# Patient Record
Sex: Female | Born: 1975 | Race: Black or African American | Hispanic: No | Marital: Married | Smoking: Never smoker
Health system: Southern US, Community
[De-identification: ages and names within clinical notes are randomized; demographics above are authoritative.]

## PROBLEM LIST (undated history)

## (undated) DIAGNOSIS — D649 Anemia, unspecified: Secondary | ICD-10-CM

## (undated) DIAGNOSIS — M67439 Ganglion, unspecified wrist: Secondary | ICD-10-CM

## (undated) DIAGNOSIS — I10 Essential (primary) hypertension: Secondary | ICD-10-CM

## (undated) HISTORY — PX: WISDOM TOOTH EXTRACTION: SHX21

---

## 2000-08-23 ENCOUNTER — Emergency Department (HOSPITAL_COMMUNITY): Admission: EM | Admit: 2000-08-23 | Discharge: 2000-08-23 | Payer: Self-pay | Admitting: Emergency Medicine

## 2000-08-23 ENCOUNTER — Encounter: Payer: Self-pay | Admitting: Internal Medicine

## 2000-12-30 ENCOUNTER — Emergency Department (HOSPITAL_COMMUNITY): Admission: EM | Admit: 2000-12-30 | Discharge: 2000-12-30 | Payer: Self-pay | Admitting: Emergency Medicine

## 2001-06-11 ENCOUNTER — Other Ambulatory Visit: Admission: RE | Admit: 2001-06-11 | Discharge: 2001-06-11 | Payer: Self-pay | Admitting: Obstetrics and Gynecology

## 2002-03-19 ENCOUNTER — Encounter: Payer: Self-pay | Admitting: Emergency Medicine

## 2002-03-19 ENCOUNTER — Emergency Department (HOSPITAL_COMMUNITY): Admission: EM | Admit: 2002-03-19 | Discharge: 2002-03-19 | Payer: Self-pay | Admitting: Emergency Medicine

## 2002-07-29 ENCOUNTER — Ambulatory Visit (HOSPITAL_COMMUNITY): Admission: RE | Admit: 2002-07-29 | Discharge: 2002-07-29 | Payer: Self-pay | Admitting: Obstetrics and Gynecology

## 2002-07-29 ENCOUNTER — Encounter: Payer: Self-pay | Admitting: Obstetrics and Gynecology

## 2002-09-27 ENCOUNTER — Encounter: Payer: Self-pay | Admitting: Obstetrics and Gynecology

## 2002-09-27 ENCOUNTER — Ambulatory Visit (HOSPITAL_COMMUNITY): Admission: RE | Admit: 2002-09-27 | Discharge: 2002-09-27 | Payer: Self-pay | Admitting: Obstetrics and Gynecology

## 2002-12-23 ENCOUNTER — Inpatient Hospital Stay (HOSPITAL_COMMUNITY): Admission: AD | Admit: 2002-12-23 | Discharge: 2002-12-23 | Payer: Self-pay | Admitting: *Deleted

## 2002-12-24 ENCOUNTER — Inpatient Hospital Stay (HOSPITAL_COMMUNITY): Admission: AD | Admit: 2002-12-24 | Discharge: 2002-12-27 | Payer: Self-pay | Admitting: Obstetrics and Gynecology

## 2003-02-05 ENCOUNTER — Other Ambulatory Visit: Admission: RE | Admit: 2003-02-05 | Discharge: 2003-02-05 | Payer: Self-pay | Admitting: Obstetrics and Gynecology

## 2004-03-10 ENCOUNTER — Other Ambulatory Visit: Admission: RE | Admit: 2004-03-10 | Discharge: 2004-03-10 | Payer: Self-pay | Admitting: Obstetrics and Gynecology

## 2005-05-09 ENCOUNTER — Other Ambulatory Visit: Admission: RE | Admit: 2005-05-09 | Discharge: 2005-05-09 | Payer: Self-pay | Admitting: Obstetrics and Gynecology

## 2005-11-24 ENCOUNTER — Observation Stay (HOSPITAL_COMMUNITY): Admission: AD | Admit: 2005-11-24 | Discharge: 2005-11-24 | Payer: Self-pay | Admitting: Obstetrics and Gynecology

## 2005-11-25 ENCOUNTER — Inpatient Hospital Stay (HOSPITAL_COMMUNITY): Admission: RE | Admit: 2005-11-25 | Discharge: 2005-11-28 | Payer: Self-pay | Admitting: Obstetrics and Gynecology

## 2008-06-29 ENCOUNTER — Inpatient Hospital Stay (HOSPITAL_COMMUNITY): Admission: AD | Admit: 2008-06-29 | Discharge: 2008-07-01 | Payer: Self-pay | Admitting: Obstetrics and Gynecology

## 2010-06-23 LAB — CBC
HCT: 39.3 % (ref 36.0–46.0)
Hemoglobin: 12.9 g/dL (ref 12.0–15.0)
Hemoglobin: 9.4 g/dL — ABNORMAL LOW (ref 12.0–15.0)
MCHC: 32.8 g/dL (ref 30.0–36.0)
MCHC: 32.8 g/dL (ref 30.0–36.0)
MCV: 82.4 fL (ref 78.0–100.0)
MCV: 82.7 fL (ref 78.0–100.0)
Platelets: 210 10*3/uL (ref 150–400)
RBC: 3.46 MIL/uL — ABNORMAL LOW (ref 3.87–5.11)
RBC: 4.78 MIL/uL (ref 3.87–5.11)
RDW: 15.5 % (ref 11.5–15.5)
RDW: 15.6 % — ABNORMAL HIGH (ref 11.5–15.5)
WBC: 10.1 10*3/uL (ref 4.0–10.5)

## 2010-07-27 NOTE — H&P (Signed)
[name], [name]              ACCOUNT NO.:  [id number]   MEDICAL RECORD NO.:  [id number]          PATIENT TYPE:  INP   LOCATION:  9167                          [facility]ILITY:  [facility]   PHYSICIAN:  Teresa Freeman, M.D. DATE OF BIRTH:  1976-02-18   DATE OF ADMISSION:  06/29/2008  DATE OF DISCHARGE:                              HISTORY & PHYSICAL   HISTORY OF PRESENT ILLNESS:  The patient is a 35 year old gravida 3,  para 2-0-0-2, admitted at 40-5/[redacted] weeks gestation with complaints of  regular painful contractions since early a.m. day of admission.  The  patient denies leakage of fluid or bleeding.  The patient's fetus moving  normally.  The patient denies any headache, vision changes, right upper  quadrant pain.  The patient is status post primary low-transverse  cesarean section with her second pregnancy and has attempted vaginal  birth after cesarean.  The patient has reviewed the vaginal birth after  cesarean consent and understands the risks.  The patient's previous low-  transverse cesarean section due to oblique lie and macrosomia.  Ultrasound at 37 weeks 6 days gestation with estimated fetal weight of  3278 grams.  The patient's pregnancy has been followed by the CNM  Service at [facility] and has been remarkable for:  1. History of low-transverse cesarean section, desires vaginal birth      after cesarean.  2. History of macrosomia.  3. Group B strep negative.  4. Sickle cell trait.   The patient's prenatal labs, initial prenatal labs, hemoglobin 12.3,  hematocrit 35.3, platelets 234,000.  Blood type B positive, antibody  screen negative, sickle cell trait positive.  RPR nonreactive, rubella  titer immune.  Hepatitis B surface antigen negative, HIV nonreactive.  Pap smear, gonorrhea, Chlamydia cultures negative.  Cystic fibrosis  negative.  First trimester screen declined.  Maternal serum AFP tetra  screen declined.  Glucola 97, RPR second trimester at 27 weeks  nonreactive.  Hemoglobin 27 weeks 10.8.  Group B strep at 36 weeks  negative.   HISTORY OF PRESENT PREGNANCY:  The patient entered care at [redacted] weeks  gestation.  The patient at that time declined first trimester screen.  At 14 weeks, the risks, benefits, alternatives, C-section versus vaginal  birth after cesarean discussed and VBAC consent was given to the patient  for review.  At 18 weeks, anatomy ultrasound was performed with normal  anatomy, cervix 3.86 cm and basically within normal limits.  fetal renal  pyelectasis.  Size was consistent with dates.  The patient received flu  vaccine at that time.  At 27 weeks, the patient underwent Glucola.  At  29 weeks, the patient met with physician to discuss vaginal birth after  cesarean and trial of labor.  Due to the history of macrosomia,  ultrasound was planned for 38 weeks.  The patient was counseled on diet  and exercise to limit weight gain.  At 29 weeks, the patient with 30-  pound weight gain.  At 33 weeks, the patient with no issues, however,  had decided upon vaginal birth after cesarean.  At 34 weeks, the patient  with nausea for 2 days as well as some diarrhea.  The patient given a  prescription for Phenergan and symptoms resolved spontaneously.  At 35  weeks, the patient with complaints of pink mucusy discharge and the  patient was diagnosed with bacterial vaginosis and treated with Flagyl.  At 36 weeks, group B strep was obtained.  At 37 weeks and 6 days,  ultrasound performed revealing resolved caliectasis as well as normal  amniotic fluid index and estimated fetal weight of 30-78 g and a fundal  placenta.  At 40 weeks, the patient with complaints of fatigue and  requested to be written out of work.  The patient also with low back  pain and was given comfort measures for low back pain at that time.   OB HISTORY:  Pregnancy #1, spontaneous vaginal delivery 2004 female  infant 8 pounds 8 ounces, 40 weeks 5 days gestation, 12-hour  labor.  No  complications.  Pregnancy #2, September 2007, primary low-transverse  cesarean section at 39 weeks 6 days female infant 9 pounds 4 ounces.  Pregnancy with oblique lie and macrosomia.  Pregnancy #3 current.   GYN HISTORY:  The patient with a history of abnormal Pap in 1999 status  post colpo.  No other treatment.  The patient with no history of STDs.  The patient with a history of OCP, IUD, Depo, and Ortho Evra patch used  for contraception in the past.  The patient with regular monthly menses.   PAST MEDICAL HISTORY:  Significant for sickle cell trait only and anemia  otherwise negative.   PAST SURGICAL HISTORY:  Vaginal low-transverse cesarean section in 2007.   MEDICATIONS:  Prenatal vitamins.   ALLERGIES:  No known drug allergies.   FAMILY HISTORY:  Father with hypertension.  Maternal grandmother with  diabetes and renal failure.   GENETIC HISTORY:  The patient with sickle cell trait only.   SOCIAL HISTORY:  The patient is a Runner, broadcasting/film/video.  The patient with 16 years of  education.  Father of the baby Fayrene Fearing who is the patient's husband and is  involved and supportive.  Father of the baby works in a [employer].  Father of the baby with 13 years of education.  The  patient denies alcohol, tobacco, or drug use.  The patient does not have  a particular religious affiliation noted.  The patient's domestic  violence screen is negative.   OBJECTIVE:  VITAL SIGNS:  The patient is afebrile.  Blood pressure  107/66, pulse 98, respirations 20.  GENERAL:  The patient is alert and oriented.  No apparent distress.  SKIN:  Warm and dry.  Color is satisfactory.  The patient is breathing  through her uterine contractions.  HEENT:  Within normal limits.  NECK:  Without masses or thyromegaly.  HEART:  Regular rate and rhythm.  LUNGS:  Clear bilaterally.  ABDOMEN: Soft, gravid, and nontender.  Fundal height 40 cm and estimated  fetal weight to palpation approximately 9  pounds, but fetus is vertex to  Leopold's maneuvers.  Fetal heart rate baseline 140s with accelerations  present and variability present.  No decelerations were noted.  Uterine  contractions are noted to be present every 2-3 minutes and are moderate  to palpation.  PELVIC:  External genitalia within normal limits.  Sterile vaginal exam  of the cervix 7 cm, 70% effaced, -3 station, and vertex.  EXTREMITIES: Negative edema, negative Homans bilaterally.  Deep tendon  reflexes are 2+ with no clonus.  ASSESSMENT:  1. Intrauterine pregnancy at 40-5/7 weeks.  2. History of previous low-transverse cesarean section, desires trial      of labor.   PLAN:  The patient to be admitted to birthing suite for consult with Dr.  Estanislado Pandy.  The patient declines epidural anesthesia at the present and  request IV pain medication.  The risks, benefits, and alternatives of  vaginal birth after cesarean again discussed with the patient and she  desires to proceed.  The patient will be monitored carefully and  continue expectant management.      Teresa Freeman, CNM      Teresa Fat Freeman, M.D.  Electronically Signed    NOS/MEDQ  D:  06/29/2008  T:  06/29/2008  Job:  [id number]

## 2010-07-30 NOTE — Discharge Summary (Signed)
NAME:  Teresa Freeman, Teresa Freeman              ACCOUNT NO.:  [id number]   MEDICAL RECORD NO.:  [id number]          PATIENT TYPE:  INP   LOCATION:  9139                          [facility]:  [facility]   PHYSICIAN:  Naima A. Dillard, M.D. DATE OF BIRTH:  [birth year]   DATE OF ADMISSION:  11/25/2005  DATE OF DISCHARGE:  11/28/2005                                 DISCHARGE SUMMARY   ADMITTING DIAGNOSES:  1. Intrauterine pregnancy at term.  2. Macrosomia.  3. Oblique lie.   DISCHARGE DIAGNOSES:  1. Intrauterine pregnancy at term.  2. Macrosomia.  3. Oblique lie.   PROCEDURES:  1. Primary low transverse cesarean section.  2. Spinal anesthesia.   HOSPITAL COURSE:  Mrs. Yoshida 35 year old gravida 2, para 1-0-0-1 who was  admitted on November 25, 2005, for elective primary low cesarean section  secondary to unstable fetal lie at term and suspected macrosomia.  The  patient had been seen for prodromal labor at [facility] on April 13 at  which time her cervix remained 3 cm.  She was given medications for  therapeutic rest.  She had an ultrasound initially at the bedside to confirm  presentation, then repeat was done during the course of her observation  showing an unstable lie with a fetus in the oblique to transverse position.  Estimated fetal weight was also noted to be 4294 grams with normal fluid.  The patient's cervix remained unchanged, and therefore, decision was made to  discharge the patient home.  She then elected to proceed with scheduled  elective cesarean section secondary to fetal macrosomia and unstable lie.  Pregnancy has been remarkable for:  1. Positive group B strep.  2. Positive sickle cell trait.  3. Bilateral fetal renal pyelectasis.   The patient was taken to the operating room on November 25, 2005, where Dr.  Dierdre Forth performed a primary low transverse cesarean section under  spinal anesthesia.  Findings were a viable female by the name of Katheren Puller, weight 9  pounds 4 ounces, Apgars were 9 and 9.  Infant was taken to  the full-term nursery.  Mother was taken to recovery in good condition.  The  fetus was in an oblique presentation.  By postop day #1, the patient is  doing well.  She was up ad lib.  Her hemoglobin was 11.1, down from 12.1,  white blood cell count 10.8 and platelet count was 192,000.  Her incision  was clean, dry and intact.  Her physical exam was within normal limits.  She  was up to shower, was able tolerate a regular diet and was breast-feeding  well.  She also was using oral pain medications.  The rest of the patient's  hospital course was uncomplicated.  By postop day #3, she was doing well.  She was up ad lib.  She was planning to have an IUD placed after 3 months  post delivery, but elected to use Micronor until that time.  Her incision  was clean, dry and intact with subcuticular sutures noted.  The rest of her  physical exam was normal.  She was deemed  to have received full benefit of  hospital stay and was discharged home.   DISCHARGE INSTRUCTIONS:  Per [facility] handout.   DISCHARGE MEDICATIONS:  1. Motrin 600 mg p.o. q.6 h p.r.n. pain.  2. Percocet 5/325 1-2 p.o. q. 3-4 hours p.r.n. pain.  3. Micronor one p.o. q.d.   FOLLOWUP:  Discharge follow-up will occur in 6 weeks at [facility].      Renaldo Reel Emilee Hero, C.N.M.      Naima A. Normand Sloop, M.D.  Electronically Signed    VLL/MEDQ  D:  11/28/2005  T:  11/29/2005  Job:  [id number]

## 2010-07-30 NOTE — Discharge Summary (Signed)
Teresa Freeman, Teresa Freeman              ACCOUNT NO.:  [id number]   MEDICAL RECORD NO.:  [id number]          PATIENT TYPE:  INP   LOCATION:  9167                          [facility]   PHYSICIAN:  Hal Morales, M.D.DATE OF BIRTH:  1976/02/05   DATE OF ADMISSION:  11/24/2005  DATE OF DISCHARGE:                                 DISCHARGE SUMMARY   ADMISSION DIAGNOSIS:  1. Intrauterine pregnancy at 39-6/7 weeks.  2. Early labor.  3. Positive group B strep.   DISCHARGE DIAGNOSES:  1. Intrauterine pregnancy at 39-6/7 weeks.  2. False labor.  3. Positive group B strep.  4. No cervical change.  5. Unstable fetal lie.  6. Suspected fetal macrosomia   HOSPITAL PROCEDURES:  1. Electronic fetal monitoring.  2. Therapeutic rest.  3. Ultrasound   HOSPITAL COURSE:  The patient was admitted with contractions every 2-3  minutes which were moderate to strong.  The patient was very uncomfortable  with contractions.  Her cervix was felt to be 3 cm, 60% effaced with a high  presenting part.  Vertex was verified by a bedside ultrasound.  She was  admitted for labor and was placed on ampicillin for group B strep  prophylaxis.  Admission was approximately 4:30 a.m.  Her cervix was  unchanged at 7:00 a.m. and she was offered therapeutic rest with morphine  and Phenergan which she accepted.  She had good pain relief and was able to  sleep for a while with this.  Uterine contractions decreased to 6 to 8  minutes apart and were milder.  Fetal heart rate remained reactive, except  during the time of morphine effects.  Cervix was rechecked at 10:00 a.m. and  found to be 2.5 cm, 40-50% effaced, presenting part was unable to be felt  and was floating high in the pelvis.  Cervix was soft but very posterior.  Discussion was had with Dr. Pennie Rushing who agreed that there was no medical  indication for Pitocin augmentation of labor and a recommendation was made  to discharge the patient home. An ultrasound for  growth showed an estimated  fetal weight of greater than 9 pounds, between 90-95th percentile, and  oblique lie.  Dr. Pennie Rushing had a discussion with the patient and her husband  outlining options for management of the suspected macrosomia and unstable  fetal lie.  After considering  these options, the parents decided to proceed  with elective scheduled cesarean section.  The risks and benefits of that  option were reviewed, and the surgery was scheduled.  The patient was then  felt to have recieved maximum hospital benefit and was discharged home.   DISCHARGE MEDICATIONS:  Prenatal vitamins.   DISCHARGE LABS:  White blood cell count 10.6, hemoglobin 12.1, platelet  count 234.  RPR pending.   DISCHARGE INSTRUCTIONS:  Include labor precautions.   DISCHARGE FOLLOW-UP:  September 14 for scheduled cesearean section.      Marie L. Williams, C.N.M.      Hal Morales, M.D.  Electronically Signed    MLW/MEDQ  D:  11/24/2005  T:  11/25/2005  Job:  [id number] 

## 2010-07-30 NOTE — H&P (Signed)
Teresa Freeman, Teresa Freeman              ACCOUNT NO.:  [id number]   MEDICAL RECORD NO.:  [id number]          PATIENT TYPE:  MAT   LOCATION:  MATC                          [facility]   PHYSICIAN:  Crist Fat. Rivard, M.D. DATE OF BIRTH:  August 22, 1975   DATE OF ADMISSION:  11/24/2005  DATE OF DISCHARGE:                                HISTORY & PHYSICAL   HISTORY OF PRESENT ILLNESS:  Teresa Freeman is a 35 year old gravida 2, para 1-0-  0-1, at 39-6/7 weeks, who presented with uterine contractions every 4  minutes for several hours.  She denied any leaking or bleeding and reports  positive fetal movement.  Cervix was 2-3 cm, 50% in the office today with  membranes stripped.  Pregnancy has been remarkable for:  (1) Positive group  B strep, (2) positive sickle cell trait, (3) bilateral fetal renal  pyelectasis.   PRENATAL LABORATORY DATA:  Blood type is B positive, Rh-antibody negative.  VDRL nonreactive.  Rubella titer positive.  Hepatitis B surface antigen  negative.  HIV was declined.  Cystic fibrosis testing was negative.  Sickle  cell trait was confirmed to be hemoglobin AS from a previous pregnancy.  GC  and Chlamydia cultures were negative.  Pap was normal in February 2007.  Hemoglobin upon entry into practice was 11.9; it was normal at 28 weeks.  Group B strep culture was positive at 36 weeks.  She did have a negative  fetal fibronectin at 29 weeks.  Quadruple screen was declined.  EDC of  November 25, 2005 was established by last menstrual period and was in  agreement with ultrasound at approximately 18 weeks.  Glucola was normal.   HISTORY OF PRESENT PREGNANCY:  The patient entered care at approximately 11-  1/2 weeks.  She denied quadruple screen.  She had an ultrasound at 18 weeks  showing normal growth and development.  She did have bilateral fetal renal  pyelectasis.  She was diagnosed with positive group B strep at 31 weeks.  She had an ultrasound at 34 weeks, showing normal growth  and development  with bilateral pyelectasis still present, but no worse.  Group B strep  culture was positive.  The rest of her pregnancy was essentially  uncomplicated.   OBSTETRICAL HISTORY:  In 2004, she had a vaginal birth of a female infant,  weight 8 pounds 8 ounces, at 40-5/7 weeks; she was in labor 12 hours.  She  had Nubain and an epidural.  She had no other complications.  That child's  name is Teresa Freeman.   MEDICAL HISTORY:  She was an oral contraceptive user on Depo-Provera and a  patch user in the past.  She had 1 abnormal Pap in 1999 for which she had a  colposcopy.  She reports the usual childhood illnesses.  She does have a  history of anemia in the past and reflux.  She has a history of a right  little toe fracture.  Her only other hospitalization was for childbirth.   GENETIC HISTORY:  Remarkable for the patient having sickle cell trait.   ALLERGIES:  The patient has no  known medication allergies.   FAMILY HISTORY:  Her father has hypertension.  Her maternal grandmother has  insulin-dependent diabetes; maternal grandmother was on dialysis as well.  Father has had a history of seizures as a teen and has had cancer of the  small intestines and has had surgery and chemotherapy.   SOCIAL HISTORY:  The patient is married to the father of the baby; he is  involved and supportive; his name is Teresa Freeman.  The patient is [place]  American, of the [place] faith.  She is graduate-educated; she is a  Runner, broadcasting/film/video.  Her husband has 1 year of college; he is employed at the [employer].  She has been followed by the certified nurse midwife  service at [facility].  She denies any alcohol, drug or tobacco use  during this pregnancy.   PHYSICAL EXAMINATION:  VITAL SIGNS:  Stable.  The patient is afebrile.  HEENT:  Within normal limits.  LUNGS:  Bilateral breath sounds are clear.  HEART:  Regular rate and rhythm without murmur.  BREASTS:  Soft and nontender.   ABDOMEN:  Fundal height is approximately 38 cm.  Estimated fetal weight is 8  to 8-1/2 pounds.  Uterine contractions are every 2-4 minutes, of moderate  quality.  Fetal heart rate is reactive.  PELVIC:  Cervical exam is 3 cm, 60%.  The presenting part is very high, but  vertex presentation is verified by bedside ultrasound.  EXTREMITIES:  Deep tendon reflexes are 2+ without clonus.  There is a trace  edema noted.   IMPRESSION:  1. Intrauterine pregnancy at 39-6/7 weeks.  2. Early labor.  3. Positive group B streptococcus.   PLAN:  1. Admit to birthing suite per consult with Dr. Silverio Lay as attending      physician.  2. Routine certified nurse midwife orders.  3. Patient declines pain medication at present.  4. We will plan antibiotic for beta strep with ampicillin due to the speed      at which it can be infused.      Teresa Freeman, C.N.M.      Crist Fat Rivard, M.D.  Electronically Signed    VLL/MEDQ  D:  11/24/2005  T:  11/24/2005  Job:  [id number]

## 2010-07-30 NOTE — H&P (Signed)
[name], Freeman              ACCOUNT NO.:  [id number]   MEDICAL RECORD NO.:  [id number]          PATIENT TYPE:  INP   LOCATION:  9199                          FACILITY:  [facility]   PHYSICIAN:  Hal Morales, M.D.DATE OF BIRTH:  01/17/1976   DATE OF ADMISSION:  11/25/2005  DATE OF DISCHARGE:                                HISTORY & PHYSICAL   Ms. Teresa Freeman is a 35 year old, gravida 2, para 1-0-0-1 who was admitted at 29  and 0/7 weeks for elective primary low transverse cesarean section secondary  to unstable fetal lie at term and suspected fetal macrosomia. The patient  denies any contractions, leakage of fluid or bleeding at the present time.  The patient reports that her fetus has been moving normally. The patient was  seen for prodromal labor at [facility] on June 24, 2005 at which time  her cervix remained unchanged at 3 cm. The patient was given medications for  therapeutic rest. At that time, ultrasound was performed to confirm  presentation which revealed unstable fetal lie with fetus in the oblique to  transverse position. Estimated fetal weight also noted to be 4294 grams as  well. Amniotic fluid volume was normal. The patient's cervix remained  unchanged over several hours and therefore a decision was made to discharge  the patient home. The patient elected to proceed with scheduled elective  cesarean section secondary to suspected macrosomia and unstable fetal lie  after the risks, benefits, and alternatives were discussed with the patient  by Dr. Pennie Rushing. The patient represents today for above noted surgery. The  patient's pregnancy remarkable for 1) positive group B strep, 2) positive  sickle cell trait, 3) bilateral fetal renal pyelectasis.   PRENATAL LABS:  Blood type B+, antibody screen negative. RPR is nonreactive.  Rubella titer immune, hepatitis B surface antigen negative, HIV declined.  Cystic fibrosis negative. Sickle cell trait confirmed to be hemoglobin  AS  from her previous pregnancy. Gonorrhea and chlamydia cultures were negative.  Pap was normal in February 2007. Hemoglobin upon entry into practice was  11.9, it was normal at 28 weeks. Group B strep culture was positive at 36  weeks. The patient did have a positive fetal fibronectin at 29 weeks,  quadruple screen was declined. EDC of November 25, 2005 was established by  last menstrual period and was in agreement with ultrasound at approximately  18 weeks. Glucola was normal.   HISTORY OF PRESENT ILLNESS:  The patient entered care at approximately 11-  1/2 weeks. She declined a quadruple screen. Ultrasound at 18 weeks showing  normal growth an development with bilateral fetal renal pyelectasis. She was  diagnosed with positive group B strep at 31 weeks. She had an ultrasound at  34 weeks showing normal growth and development with bilateral fetal renal  pyelectasis still present but stable. The rest of her pregnancy was  essentially uncomplicated. The patient's pregnancy has been followed by CNM  service at [facility].   OBSTETRICAL HISTORY:  Pregnancy #1 2004, vaginal birth, female infant, 8  pounds 8 ounces, 40-5/7 weeks with a 12 hour labor without  complications.   GYN HISTORY:  History of abnormal Pap x1 with colposcopy. No other  treatment. Followup Paps have been normal. Abnormal Pap noted in 1999. The  patient denies history of STDs. The patient reports regular monthly menses.  No known drug allergies.   CURRENT MEDICATIONS:  Prenatal vitamins daily.   PAST MEDICAL HISTORY:  Significant for anemia in the past and GERD. No  current medications. Patient also with a history of fractured right fifth  toe.   PAST SURGICAL HISTORY:  Negative.   GENETIC HISTORY:  Negative with the exception of the sickle cell trait.   FAMILY HISTORY:  Father with chronic hypertension, seizures, colon cancer.  Maternal grandmother diabetes and renal failure secondary to diabetes   otherwise negative.   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 Father  of the baby, [name], is involved and supportive with 13 years of  education. The father of the baby works at the [employer].  The patient denies the use of tobacco, alcohol or street drugs. The  patient's domestic violence screen is negative. The patient is [place] and she and her husband are [place] in faith.   PHYSICAL EXAMINATION:  VITAL SIGNS:  The patient is afebrile, vital signs  are stable.  GENERAL:  The patient is in no apparent distress.  SKIN:  Warm and dry, color is satisfactory.  HEENT:  Within normal limits.  NECK:  With no thyromegaly noted and is negative.  LUNGS:  Clear.  HEART:  Regular rate and rhythm.  BREASTS:  Soft.  ABDOMEN:  Soft, gravid and nontender with positive bowel sounds in all four  quadrants. Fundal height extending to 40 cm. Fetus is noted to be vertex to  Leopold's maneuver. No lymphadenopathy is noted.  PELVIC:  Deferred on admission with pelvic exam within normal limits and  cervix 3 cm upon discharge November 24, 2005.  EXTREMITIES:  Negative edema bilaterally, negative Homans bilaterally, deep  tendon reflexes are 2+ and clonus. Skin is intact.   ASSESSMENT:  1. Intrauterine pregnancy at 40 and 0/7 weeks.  2. Unstable fetal lie at term.  3. Suspected fetal microsomia.   PLAN:  The risks, benefits, and alternatives of a primary low transverse  cesarean section discussed with the patient by Dr. Pennie Rushing on November 24, 2005 versus awaiting onset of spontaneous labor. The patient elects primary  low transverse cesarean section. The patient admitted to [facility]  for same day surgery for the above noted procedure. Routine preop orders per  Dr. Pennie Rushing.      Rhona Leavens, CNM      Hal Morales, M.D.  Electronically Signed    NOS/MEDQ  D:  11/25/2005  T:  11/25/2005  Job:  [id number]

## 2010-07-30 NOTE — H&P (Signed)
NAME:  BURGANDY, HACKWORTH                        ACCOUNT NO.:  [id number]   MEDICAL RECORD NO.:  [id number]                   PATIENT TYPE:  INP   LOCATION:  9169                                 [facility]ACILITY:  [facility]   PHYSICIAN:  Osborn Coho, M.D.                DATE OF BIRTH:  Mar 22, 1975   DATE OF ADMISSION:  12/24/2002  DATE OF DISCHARGE:                                HISTORY & PHYSICAL   Ms. Pandey is a 35 year old married black female, prima gravida at 40-4/7  weeks who presents with regular uterine contractions today.  She denies  leaking, bleeding, headache, nausea, vomiting, visual disturbances.  She was  evaluated yesterday in the office as well as last night at [facility]  for labor check.  When she was discharged from the hospital, her cervix was  2 cm and 80% effaced.   Her pregnancy has been followed by the [facility] Certified  Nurse Midwife service and has been remarkable for 1) first trimester  bleeding.  2) Marginal placenta previa, resolved.  3)  Succenturiate low.  4)  Group B strep negative.   LABORATORY DATA:  Her prenatal labs were collected on June 03, 2002, with  hemoglobin 12.2, hematocrit 36.4, platelets 295,000.  Blood type B positive,  antibody negative, RPR nonreactive, rubella immune, hepatitis B surface  antigen negative, HIV nonreactive, gonorrhea negative, Chlamydia negative,  cystic fibrosis negative.  On October 01, 2002, her one-hour Glucola was 110.  Culture of vaginal tract for group B strep on November 15, 2002, was  negative.  Gonorrhea and Chlamydia testing at that time was negative as  well.  Sickle cell results are unknown and will be drawn with hospital admit  labs.   HISTORY OF PRESENT PREGNANCY:  She presented for care on June 03, 2002, at  11-1/[redacted] weeks gestation.  Pregnancy ultrasonography on Jul 26, 2002, showed  growth consistent with previous dating.  Also showed succenturiate low with  placenta.  Followup ultrasound  at [facility] on Jul 30, 2002, showed  posterior placenta with probable succenturiate low and marginal placenta  previa.  Followup ultrasonography on September 27, 2002, showed resolution of the  marginal previa.  The rest of her prenatal care has been remarkable.   PAST OBSTRETICAL HISTORY:  She is a primigravida.   PAST MEDICAL HISTORY:  Normal childhood illnesses.  She has a history of  anemia approximately six years ago.  History of indigestion.  She broke her  small toe on her right foot in the past.   ALLERGIES:  No medication allergies.   PAST GYNECOLOGICAL HISTORY:  She reports having used Depo-Provera as well as  oral contraceptives in the past for birth control.  She reports an abnormal  Pap smear in 1999 followed by colposcopy and normal Pap smear since.  She  reports rare yeast infection.   PAST SURGICAL HISTORY:  None.   FAMILY MEDICAL  HISTORY:  Remarkable for father with history of hypertension,  maternal grandmother with insulin-dependent diabetes mellitus, maternal  grandmother with dialysis, father with history of seizures as a teen, father  with history of cancer of small intestines.   GENETIC HISTORY:  Unremarkable.   SOCIAL HISTORY:  The patient is married to the father of the baby.  His name  is Fayrene Fearing.  He is involved and supportive.  They are both college educated  and work full time.  Deny alcohol, tobacco, or illicit drug use with this  pregnancy.   PHYSICAL EXAMINATION:  VITAL SIGNS:  Stable.  She is afebrile.  HEENT:  Grossly  within normal limits.  CHEST:  Clear to auscultation.  HEART:  Regular rate and rhythm.  ABDOMEN:  Gravid in contour  with fundal height extending approximately 4 cm  above pubic symphysis.  Fetal heart rate is reactive and reassuring.  PELVIC:  Uterine contractions every 3 minutes.  Cervix is 5 cm.  EXTREMITIES:  Within normal limits..   ASSESSMENT:  1. Intrauterine pregnancy at term.  2. Active labor.   PLAN:  1. Admit  to birthing suite for consult with Dr. Su Hilt.  2. Routine CNM orders.  3. The patient plans IV pain medications for now.  4. Anticipate spontaneous vaginal birth.     Cam Hai, C.N.M.                     Osborn Coho, M.D.    KS/MEDQ  D:  12/24/2002  T:  12/24/2002  Job:  [id number]

## 2010-07-30 NOTE — Op Note (Signed)
NAMEBRIANY, Teresa Freeman              ACCOUNT NO.:  [id number]   MEDICAL RECORD NO.:  [id number]          PATIENT TYPE:  INP   LOCATION:  9139                          [facility]ACILITY:  [facility]   PHYSICIAN:  Hal Morales, M.D.DATE OF BIRTH:  1976-02-15   DATE OF PROCEDURE:  11/25/2005  DATE OF DISCHARGE:                                 OPERATIVE REPORT   PREOPERATIVE DIAGNOSIS:  Intrauterine pregnancy at term, fetal macrosomia,  oblique lie.   POSTOPERATIVE DIAGNOSES:  Intrauterine pregnancy at term, fetal macrosomia,  oblique lie.   OPERATION:  Primary low transverse cesarean section.   SURGEON:  Hal Morales, M.D.   FIRST ASSISTANT:  Rhona Leavens, CNM   ANESTHESIA:  Spinal.   ESTIMATED BLOOD LOSS:  1000 mL.   COMPLICATIONS:  None.   FINDINGS:  The patient was delivered of a female infant who is named Barrett Henle weighing 9 pounds 4 ounces with Apgars 9 and 9 at 1 and 5  minutes respectively.  The uterus, tubes and ovaries were normal for the  gravid state.  The placenta contained an eccentrically inserted three vessel  cord.   PROCEDURE:  The patient was taken to the operating room after appropriate  identification and placed on the operating table.  After placement of a  spinal anesthetic, she was placed in the supine position with a left lateral  tilt.  The abdomen and perineum were prepped with multiple layers of  Betadine and the Foley catheter inserted into the bladder and connected to  straight drainage.  The abdomen was draped in a sterile field.  After the  assurance of adequate anesthesia, the suprapubic region was infiltrated with  10 mL of 0.25% Marcaine.  A suprapubic incision was then made.  The abdomen  was opened in layers and the peritoneum entered.  The bladder blade was  placed and the uterus incised approximately 2 cm above the uterovesical  fold.  That incision was taken laterally on either side bluntly.  The  amniotic sac was then ruptured  and the infant delivered from the occiput  transverse position.  The nares and pharynx were suctioned and the cord  clamped and cut.  The infant was handed off to the awaiting pediatricians.  The placenta was allowed to separate from the uterus and was removed from  the operative field.  This was given to the nursing residents for cord blood  collection.  The remainder of membranes were removed from the endometrial  cavity and the uterine incision closed with a running interlocking suture of  0 Vicryl.  An imbricating suture of 0 Vicryl was then placed with adequate  hemostasis.  Copious irrigation was carried out.  The abdominal peritoneum  was closed with a running suture of 2-0 Vicryl.  The rectus muscles were  reapproximated in the midline with a figure-of-eight suture of 2-0 Vicryl.  The rectus fascia was closed with a running suture of 0 Vicryl and  reinforced on either side of the midline with figure-of-eight sutures of 0  Vicryl.  The subcutaneous tissues were made hemostatic with Bovie cautery  and irrigated.  A subcuticular suture of 3-0 Monocryl was placed and a  sterile  dressing placed.  The patient was taken to the recovery room in satisfactory  condition having tolerated the procedure well with sponge and instrument  counts correct.  The infant went to the full term nursery.  The placenta was  sent to labor and delivery.      Hal Morales, M.D.  Electronically Signed     VPH/MEDQ  D:  11/25/2005  T:  11/26/2005  Job:  [id number]

## 2011-10-28 ENCOUNTER — Other Ambulatory Visit: Payer: Self-pay

## 2011-10-28 ENCOUNTER — Telehealth: Payer: Self-pay

## 2011-10-28 ENCOUNTER — Telehealth: Payer: Self-pay | Admitting: Obstetrics and Gynecology

## 2011-10-28 MED ORDER — NORETHIN ACE-ETH ESTRAD-FE 1-20 MG-MCG PO TABS: 1.0000 | ORAL_TABLET | Freq: Every day | ORAL | Status: DC

## 2011-10-28 NOTE — Telephone Encounter (Signed)
PT DID CALL BACK REGARDING RX. SEND IN ORDER JUNEL 1/20 PER JO PER AR FOR PT.PT VOICED UNDERSTANDING.

## 2011-10-28 NOTE — Telephone Encounter (Signed)
LM FOR PT TO CALL BACK. 

## 2011-10-28 NOTE — Telephone Encounter (Signed)
TRIAGE/BC CHANGE

## 2011-10-28 NOTE — Telephone Encounter (Signed)
TC TO PT REGARDING MESSAGE. PT WOULD LIKE A CHEAPER B/C THAN LOLOESTRIN. INFORMED PT TO CALL THE PHARMACY AND SEE WHAT THE OPTIONS ARE THEY OFFER AND WHAT BEST FIT YOUR BUDGET AND WE WILL CALL THAT IN. PT VOICED UNDERSTANDING.

## 2012-05-12 DIAGNOSIS — M67439 Ganglion, unspecified wrist: Secondary | ICD-10-CM

## 2012-05-12 HISTORY — DX: Ganglion, unspecified wrist: M67.439

## 2012-05-29 ENCOUNTER — Other Ambulatory Visit: Payer: Self-pay | Admitting: Orthopedic Surgery

## 2012-05-29 ENCOUNTER — Encounter (HOSPITAL_BASED_OUTPATIENT_CLINIC_OR_DEPARTMENT_OTHER): Payer: Self-pay | Admitting: *Deleted

## 2012-06-04 ENCOUNTER — Encounter (HOSPITAL_BASED_OUTPATIENT_CLINIC_OR_DEPARTMENT_OTHER): Payer: Self-pay | Admitting: Certified Registered Nurse Anesthetist

## 2012-06-04 ENCOUNTER — Encounter (HOSPITAL_BASED_OUTPATIENT_CLINIC_OR_DEPARTMENT_OTHER): Payer: Self-pay | Admitting: *Deleted

## 2012-06-04 ENCOUNTER — Ambulatory Visit (HOSPITAL_BASED_OUTPATIENT_CLINIC_OR_DEPARTMENT_OTHER): Payer: BC Managed Care – PPO | Admitting: Certified Registered Nurse Anesthetist

## 2012-06-04 ENCOUNTER — Encounter (HOSPITAL_BASED_OUTPATIENT_CLINIC_OR_DEPARTMENT_OTHER)
Admission: RE | Disposition: A | Discharge status: Home / Self Care | Payer: Self-pay | Source: Ambulatory Visit | Attending: Orthopedic Surgery

## 2012-06-04 ENCOUNTER — Ambulatory Visit (HOSPITAL_BASED_OUTPATIENT_CLINIC_OR_DEPARTMENT_OTHER)
Admission: RE | Admit: 2012-06-04 | Discharge: 2012-06-04 | Disposition: A | Discharge status: Home / Self Care | Payer: BC Managed Care – PPO | Source: Ambulatory Visit | Attending: Orthopedic Surgery | Admitting: Orthopedic Surgery

## 2012-06-04 DIAGNOSIS — M6740 Ganglion, unspecified site: Secondary | ICD-10-CM | POA: Insufficient documentation

## 2012-06-04 HISTORY — PX: GANGLION CYST EXCISION: SHX1691

## 2012-06-04 HISTORY — DX: Ganglion, unspecified wrist: M67.439

## 2012-06-04 LAB — POCT HEMOGLOBIN-HEMACUE: Hemoglobin: 12.5 g/dL (ref 12.0–15.0)

## 2012-06-04 SURGERY — EXCISION, GANGLION CYST, WRIST
Anesthesia: General | Site: Wrist | Laterality: Right | Wound class: Clean

## 2012-06-04 MED ORDER — MIDAZOLAM HCL 2 MG/2ML IJ SOLN: 0.5000 mg | Freq: Once | INTRAMUSCULAR | Status: DC | PRN

## 2012-06-04 MED ORDER — CEFAZOLIN SODIUM-DEXTROSE 2-3 GM-% IV SOLR
2.0000 g | INTRAVENOUS | Status: AC
  Administered 2012-06-04: 2 g via INTRAVENOUS

## 2012-06-04 MED ORDER — OXYCODONE HCL 5 MG PO TABS: 5.0000 mg | ORAL_TABLET | Freq: Once | ORAL | Status: DC | PRN

## 2012-06-04 MED ORDER — MEPERIDINE HCL 25 MG/ML IJ SOLN: 6.2500 mg | INTRAMUSCULAR | Status: DC | PRN

## 2012-06-04 MED ORDER — MIDAZOLAM HCL 5 MG/5ML IJ SOLN
INTRAMUSCULAR | Status: DC | PRN
  Administered 2012-06-04: 2 mg via INTRAVENOUS

## 2012-06-04 MED ORDER — MIDAZOLAM HCL 2 MG/ML PO SYRP: 12.0000 mg | ORAL_SOLUTION | Freq: Once | ORAL | Status: DC | PRN

## 2012-06-04 MED ORDER — OXYCODONE HCL 5 MG/5ML PO SOLN
5.0000 mg | Freq: Once | ORAL | Status: DC | PRN
Start: 2012-06-04 — End: 2012-06-04

## 2012-06-04 MED ORDER — FENTANYL CITRATE 0.05 MG/ML IJ SOLN
INTRAMUSCULAR | Status: DC | PRN
  Administered 2012-06-04: 25 ug via INTRAVENOUS
  Administered 2012-06-04 (×2): 50 ug via INTRAVENOUS

## 2012-06-04 MED ORDER — DEXAMETHASONE SODIUM PHOSPHATE 10 MG/ML IJ SOLN
INTRAMUSCULAR | Status: DC | PRN
  Administered 2012-06-04: 10 mg via INTRAVENOUS

## 2012-06-04 MED ORDER — PROPOFOL 10 MG/ML IV BOLUS
INTRAVENOUS | Status: DC | PRN
  Administered 2012-06-04: 200 mg via INTRAVENOUS

## 2012-06-04 MED ORDER — ONDANSETRON HCL 4 MG/2ML IJ SOLN
INTRAMUSCULAR | Status: DC | PRN
  Administered 2012-06-04: 4 mg via INTRAVENOUS

## 2012-06-04 MED ORDER — MIDAZOLAM HCL 2 MG/2ML IJ SOLN: 1.0000 mg | INTRAMUSCULAR | Status: DC | PRN

## 2012-06-04 MED ORDER — LIDOCAINE HCL (CARDIAC) 20 MG/ML IV SOLN
INTRAVENOUS | Status: DC | PRN
  Administered 2012-06-04: 60 mg via INTRAVENOUS

## 2012-06-04 MED ORDER — HYDROCODONE-ACETAMINOPHEN 5-325 MG PO TABS: 1.0000 | ORAL_TABLET | ORAL | Status: DC | PRN

## 2012-06-04 MED ORDER — LACTATED RINGERS IV SOLN
INTRAVENOUS | Status: DC
  Administered 2012-06-04 (×2): via INTRAVENOUS

## 2012-06-04 MED ORDER — LACTATED RINGERS IV SOLN: INTRAVENOUS | Status: DC

## 2012-06-04 MED ORDER — FENTANYL CITRATE 0.05 MG/ML IJ SOLN
25.0000 ug | INTRAMUSCULAR | Status: DC | PRN
  Administered 2012-06-04: 50 ug via INTRAVENOUS

## 2012-06-04 MED ORDER — PROMETHAZINE HCL 25 MG/ML IJ SOLN: 6.2500 mg | INTRAMUSCULAR | Status: DC | PRN

## 2012-06-04 MED ORDER — FENTANYL CITRATE 0.05 MG/ML IJ SOLN: 50.0000 ug | INTRAMUSCULAR | Status: DC | PRN

## 2012-06-04 MED ORDER — BUPIVACAINE-EPINEPHRINE PF 0.5-1:200000 % IJ SOLN
INTRAMUSCULAR | Status: DC | PRN
  Administered 2012-06-04: 10 mL

## 2012-06-04 SURGICAL SUPPLY — 36 items
BANDAGE COBAN STERILE 2 (GAUZE/BANDAGES/DRESSINGS) IMPLANT
BANDAGE GAUZE ELAST BULKY 4 IN (GAUZE/BANDAGES/DRESSINGS) ×4 IMPLANT
BLADE MINI RND TIP GREEN BEAV (BLADE) IMPLANT
BLADE SURG 15 STRL LF DISP TIS (BLADE) ×1 IMPLANT
BLADE SURG 15 STRL SS (BLADE) ×2
BNDG CMPR 9X4 STRL LF SNTH (GAUZE/BANDAGES/DRESSINGS) ×1
BNDG COHESIVE 4X5 TAN STRL (GAUZE/BANDAGES/DRESSINGS) ×2 IMPLANT
BNDG ESMARK 4X9 LF (GAUZE/BANDAGES/DRESSINGS) ×2 IMPLANT
CHLORAPREP W/TINT 26ML (MISCELLANEOUS) ×2 IMPLANT
COVER MAYO STAND STRL (DRAPES) ×2 IMPLANT
COVER TABLE BACK 60X90 (DRAPES) ×2 IMPLANT
CUFF TOURNIQUET SINGLE 18IN (TOURNIQUET CUFF) ×2 IMPLANT
DRAPE EXTREMITY T 121X128X90 (DRAPE) ×2 IMPLANT
DRAPE SURG 17X23 STRL (DRAPES) ×2 IMPLANT
DRSG EMULSION OIL 3X3 NADH (GAUZE/BANDAGES/DRESSINGS) ×2 IMPLANT
GLOVE BIO SURGEON STRL SZ7.5 (GLOVE) ×2 IMPLANT
GLOVE BIOGEL PI IND STRL 8 (GLOVE) ×1 IMPLANT
GLOVE BIOGEL PI INDICATOR 8 (GLOVE) ×1
GLOVE ECLIPSE 6.5 STRL STRAW (GLOVE) ×4 IMPLANT
GOWN PREVENTION PLUS XLARGE (GOWN DISPOSABLE) ×4 IMPLANT
NEEDLE HYPO 25X1 1.5 SAFETY (NEEDLE) ×2 IMPLANT
NS IRRIG 1000ML POUR BTL (IV SOLUTION) ×2 IMPLANT
PACK BASIN DAY SURGERY FS (CUSTOM PROCEDURE TRAY) ×2 IMPLANT
PAD CAST 4YDX4 CTTN HI CHSV (CAST SUPPLIES) ×1 IMPLANT
PADDING CAST ABS 4INX4YD NS (CAST SUPPLIES)
PADDING CAST ABS COTTON 4X4 ST (CAST SUPPLIES) IMPLANT
PADDING CAST COTTON 4X4 STRL (CAST SUPPLIES) ×1
RUBBERBAND STERILE (MISCELLANEOUS) IMPLANT
SPONGE GAUZE 4X4 12PLY (GAUZE/BANDAGES/DRESSINGS) ×2 IMPLANT
STOCKINETTE 4X48 STRL (DRAPES) ×2 IMPLANT
SUT VICRYL RAPIDE 4/0 PS 2 (SUTURE) ×2 IMPLANT
SYR BULB 3OZ (MISCELLANEOUS) ×2 IMPLANT
SYRINGE 10CC LL (SYRINGE) ×2 IMPLANT
TOWEL OR 17X24 6PK STRL BLUE (TOWEL DISPOSABLE) ×2 IMPLANT
TOWEL OR NON WOVEN STRL DISP B (DISPOSABLE) ×2 IMPLANT
UNDERPAD 30X30 INCONTINENT (UNDERPADS AND DIAPERS) ×2 IMPLANT

## 2012-06-04 NOTE — Anesthesia Preprocedure Evaluation (Signed)
Anesthesia Evaluation  Patient identified by MRN, date of birth, ID band Patient awake    Reviewed: Allergy & Precautions, H&P , NPO status , Patient's Chart, lab work & pertinent test results  History of Anesthesia Complications Negative for: history of anesthetic complications  Airway Mallampati: I TM Distance: >3 FB Neck ROM: Full    Dental  (+) Dental Advisory Given   Pulmonary neg pulmonary ROS,  breath sounds clear to auscultation  Pulmonary exam normal       Cardiovascular negative cardio ROS  Rhythm:Regular Rate:Normal     Neuro/Psych negative neurological ROS  negative psych ROS   GI/Hepatic negative GI ROS, Neg liver ROS,   Endo/Other  negative endocrine ROS  Renal/GU negative Renal ROS     Musculoskeletal negative musculoskeletal ROS (+)   Abdominal   Peds  Hematology negative hematology ROS (+)   Anesthesia Other Findings   Reproductive/Obstetrics OCPs: LMP 3 weeks ago                           Anesthesia Physical Anesthesia Plan  ASA: I  Anesthesia Plan: General   Post-op Pain Management:    Induction: Intravenous  Airway Management Planned:   Additional Equipment:   Intra-op Plan:   Post-operative Plan:   Informed Consent: I have reviewed the patients History and Physical, chart, labs and discussed the procedure including the risks, benefits and alternatives for the proposed anesthesia with the patient or authorized representative who has indicated his/her understanding and acceptance.   Dental advisory given  Plan Discussed with: CRNA and Surgeon  Anesthesia Plan Comments: (Plan routine monitors, GA- LMA OK)        Anesthesia Quick Evaluation

## 2012-06-04 NOTE — Transfer of Care (Signed)
Immediate Anesthesia Transfer of Care Note  Patient: Teresa Freeman  Procedure(s) Performed: Procedure(s): RIGHT DORSAL GANGLION EXCISION OF WRIST (Right)  Patient Location: PACU  Anesthesia Type:General  Level of Consciousness: sedated  Airway & Oxygen Therapy: Patient Spontanous Breathing and Patient connected to face mask oxygen  Post-op Assessment: Report given to PACU RN and Post -op Vital signs reviewed and stable  Post vital signs: Reviewed and stable  Complications: No apparent anesthesia complications

## 2012-06-04 NOTE — Interval H&P Note (Signed)
History and Physical Interval Note:  06/04/2012 8:38 AM  Teresa Freeman  has presented today for surgery, with the diagnosis of RIGHT DORSAL WRIST GANGLION  The various methods of treatment have been discussed with the patient and family. After consideration of risks, benefits and other options for treatment, the patient has consented to  Procedure(s): RIGHT DORSAL GANGLION EXCISION OF WRIST (Right) as a surgical intervention .  The patient's history has been reviewed, patient examined, no change in status, stable for surgery.  I have reviewed the patient's chart and labs.  Questions were answered to the patient's satisfaction.     Lajuana Patchell A.

## 2012-06-04 NOTE — Anesthesia Postprocedure Evaluation (Signed)
  Anesthesia Post-op Note  Patient: Teresa Freeman  Procedure(s) Performed: Procedure(s): RIGHT DORSAL GANGLION EXCISION OF WRIST (Right)  Patient Location: PACU  Anesthesia Type:General  Level of Consciousness: awake, alert , oriented and patient cooperative  Airway and Oxygen Therapy: Patient Spontanous Breathing  Post-op Pain: none  Post-op Assessment: Post-op Vital signs reviewed, Patient's Cardiovascular Status Stable, Respiratory Function Stable, Patent Airway, No signs of Nausea or vomiting and Pain level controlled  Post-op Vital Signs: Reviewed and stable  Complications: No apparent anesthesia complications

## 2012-06-04 NOTE — Op Note (Signed)
06/04/2012  8:45 AM  PATIENT:  Teresa Freeman  37 y.o. female  PRE-OPERATIVE DIAGNOSIS:  Right dorsal wrist ganglion  POST-OPERATIVE DIAGNOSIS:  Same  PROCEDURE:  Excision of right dorsal wrist ganglion  SURGEON: Cliffton Asters. Janee Morn, MD  PHYSICIAN ASSISTANT: None  ANESTHESIA:  general  SPECIMENS:  None  DRAINS:   None  PREOPERATIVE INDICATIONS:  Teresa Freeman is a  37 y.o. female with a diagnosis of right dorsal wrist ganglion who failed conservative measures and elected for surgical management.    The risks benefits and alternatives were discussed with the patient preoperatively including but not limited to the risks of infection, bleeding, nerve injury, cardiopulmonary complications, the need for revision surgery, among others, and the patient verbalized understanding and consented to proceed.  OPERATIVE IMPLANTS: None  OPERATIVE FINDINGS: Bilobed dorsal ganglion emanating from the wrist capsule over the scapholunate ligament. SL IL intact.  OPERATIVE PROCEDURE:  After receiving prophylactic antibiotics, the patient was escorted to the operative theatre and placed in a supine position. General anesthesia was administered. A surgical "time-out" was performed during which the planned procedure, proposed operative site, and the correct patient identity were compared to the operative consent and agreement confirmed by the circulating nurse according to current facility policy.  Following application of a tourniquet to the operative extremity, the exposed skin was prepped with Chloraprep and draped in the usual sterile fashion.  The limb was exsanguinated with an Esmarch bandage and the tourniquet inflated to approximately higher than systolic BP.  A transverse incision was made over the prominence the mass. The skin was incised sharply with scalpel and subcutaneous tissues with blunt and spreading dissection. There was a small cutaneous nerve and accompanying vessel crossing  the mass which was preserved and retracted. Some of the wrist retinaculum was split in the course of exposing the mass. This was meticulously dissected. There were 2 lobes of the mass, partially separated by a portion of the wrist retinaculum. The lobes were resected independently. The deepest lobe was part of the capsule overlying the scapholunate interosseous ligament. This mass and portion of capsule was excised in its entirety leaving a one by one opening in the wrist capsule. The scapholunate interosseous ligament was intact. Tourniquet was released and additional hemostasis obtained with bipolar cautery and the wound is copiously irrigated before being closed with 4-0 Vicryl Rapide interrupted deep dermal sutures and a running subcuticular suture followed by benzoin and Steri-Strips. A bulky dressing was applied after 10 mL's of half percent Marcaine with epinephrine was instilled around the region of the incision for postoperative pain control. She was awakened and taken to the recovery area.  DISPOSITION: She will be discharged home today with typical postop instructions returning in 10-15 days for reassessment. She should transition to a removable wrist splint at that time for an additional 3-4 weeks.

## 2012-06-04 NOTE — Anesthesia Procedure Notes (Signed)
Procedure Name: LMA Insertion Date/Time: 06/04/2012 11:13 AM Performed by: Monica Zahler D Pre-anesthesia Checklist: Patient identified, Emergency Drugs available, Suction available and Patient being monitored Patient Re-evaluated:Patient Re-evaluated prior to inductionOxygen Delivery Method: Circle System Utilized Preoxygenation: Pre-oxygenation with 100% oxygen Intubation Type: IV induction Ventilation: Mask ventilation without difficulty LMA: LMA inserted LMA Size: 4.0 Number of attempts: 1 Airway Equipment and Method: bite block Placement Confirmation: positive ETCO2 Tube secured with: Tape Dental Injury: Teeth and Oropharynx as per pre-operative assessment

## 2012-06-04 NOTE — H&P (Signed)
Teresa Freeman is an 37 y.o. female.   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  It has grown consistently larger without fluctuating in size.  It has not been aspirated.  The pain radiates somewhat proximally and distally.  Past Medical History  Diagnosis Date  . Ganglion cyst of wrist 05/2012    right    Past Surgical History  Procedure Laterality Date  . Cesarean section  11/25/2005    History reviewed. No pertinent family history. Social History:  reports that she has never smoked. She has never used smokeless tobacco. She reports that she does not drink alcohol or use illicit drugs.  Allergies: No Known Allergies  No prescriptions prior to admission    No results found for this or any previous visit (from the past 48 hour(s)). No results found.  Review of Systems  All other systems reviewed and are negative.    Height 5' 6.5" (1.689 m), weight 79.379 kg (175 lb), last menstrual period 05/21/2012. Physical Exam   Constitutional:  WD, WN, NAD HEENT:  NCAT, EOMI Neuro/Psych:  Alert & oriented to person, place, and time; appropriate mood & affect Lymphatic: No generalized UE edema or lymphadenopathy Extremities / MSK:  Both UE are normal with respect to appearance, ranges of motion, joint stability, muscle strength/tone, sensation, & perfusion except as otherwise noted:  Right dorsal wrist mass 15 mm in diameter consistent in location and consistency with a dorsal ganglion.  Watson's maneuver negative.  Labs / X-rays: No radiographic studies obtained today.  Assessment:  Right dorsal wrist ganglion, symptomatic  Plan:  Surgical excision.  B/R/O d/w pt.  Will proceed on outpt basis.  Adriel Kessen A. 06/04/2012, 12:16  AM

## 2012-06-05 ENCOUNTER — Encounter (HOSPITAL_BASED_OUTPATIENT_CLINIC_OR_DEPARTMENT_OTHER): Payer: Self-pay | Admitting: Orthopedic Surgery

## 2012-12-05 ENCOUNTER — Other Ambulatory Visit: Payer: Self-pay | Admitting: Obstetrics and Gynecology

## 2012-12-26 ENCOUNTER — Encounter (HOSPITAL_COMMUNITY): Payer: Self-pay | Admitting: Pharmacist

## 2012-12-31 ENCOUNTER — Other Ambulatory Visit: Payer: Self-pay | Admitting: Obstetrics and Gynecology

## 2012-12-31 NOTE — H&P (Signed)
  Teresa Freeman is a 36 y.o. female  P 3-0-0-3 for sterilization.  Patient has used Depo Provera in the past and currently is using oral contraceptives.  Her menses lasts for 5 days with pad change every 4 hours and minimal cramping (rated as 4/10 on a 10 point pain scale-no analgesia required).  Patient states that she wants to end her child-bearing potential and therefore wants to proceed with sterilization.   Past Medical History  OB History: G 3;  P 3-0-0-3  GYN History: menarche: 37YO   LMP: 12/30/12;  Contracepton: Oral Contraceptives; Denies history of STDs;   Remote history of abnormal PAP smear in 1999, repeated and normal since Last PAP smear: 11/2012  Medical History: GERD, migraine, right 5th toe fracture  Surgical History: 06/04/2012  Excision of Right Wrist Ganglion Cyst Denies problems with anesthesia or history of blood transfusions  Family History:   Diabetes mellitus, renal failure, hypertension, multiply myeloma, seizures, gastrointestinal cancer  Social History:  Married and Employed as a Teacher with [employer]:  Denies alcohol, tobacco or illicit drug use.   Outpatient Encounter Prescriptions as of 12/31/2012  Medication Sig Dispense Refill  . Multiple Vitamins-Minerals (MULTIVITAMIN WITH MINERALS) tablet Take 1 tablet by mouth daily.      . norethindrone-ethinyl estradiol (JUNEL FE 1/20) 1-20 MG-MCG tablet Take 1 tablet by mouth daily.  1 Package  11   No facility-administered encounter medications on file as of 12/31/2012.    No Known Allergies  Denies sensitivity to peanuts, shellfish, soy, latex or adhesives.   ROS:  Denies corrective lenses, headache, vision changes, nasal congestion, dysphagia, tinnitus, dizziness, hoarseness, cough,  chest pain, shortness of breath, nausea, vomiting, diarrhea,constipation,  urinary frequency, urgency  dysuria, hematuria, vaginitis symptoms, pelvic pain, swelling of joints,easy bruising,  myalgias,  arthralgias, skin rashes, unexplained weight loss and except as is mentioned in the history of present illness, patient's review of systems is otherwise negative.   Physical Exam  Bp: 118/78     P: 68   R:  16   Temperature:   99.1 degrees F   Weight:  187.5 lbs.  Height: 5 ' 6.5 "  Neck: supple without masses or thyromegaly Lungs: clear to auscultation Heart: regular rate and rhythm Abdomen: soft, non-tender and no organomegaly Pelvic:EGBUS- wnl; vagina-normal rugae, small blood; uterus-normal size, cervix without lesions or motion tenderness; adnexae-no tenderness or masses Extremities:  no clubbing, cyanosis or edema   Assesment:  Desire for Sterilzation   Disposition:  A discussion was held with patient regarding the indication for her procedure(s) along with the risks, which include but are not limited to: reaction to anesthesia, damage to adjacent organs, infection, excessive bleeding and a pregnancy  failure rate of 2/500.  Patient verbalized understanding of these risks and has consented to proceed with Bilateral Salpingectomy with Possible Bilateral Fallopian Tube Fulguration at [facility],  January 11, 2013 at 2 p.m.   CSN# [id number]   Romana Deaton J. Akhila Mahnken, PA-C  for Dr. Angela Y. Roberts   

## 2013-01-11 ENCOUNTER — Ambulatory Visit (HOSPITAL_COMMUNITY)
Admission: RE | Admit: 2013-01-11 | Discharge: 2013-01-11 | Disposition: A | Discharge status: Home / Self Care | Payer: BC Managed Care – PPO | Source: Ambulatory Visit | Attending: Obstetrics and Gynecology | Admitting: Obstetrics and Gynecology

## 2013-01-11 ENCOUNTER — Ambulatory Visit (HOSPITAL_COMMUNITY): Payer: BC Managed Care – PPO | Admitting: Anesthesiology

## 2013-01-11 ENCOUNTER — Encounter (HOSPITAL_COMMUNITY): Payer: Self-pay | Admitting: Anesthesiology

## 2013-01-11 ENCOUNTER — Encounter (HOSPITAL_COMMUNITY): Payer: BC Managed Care – PPO | Admitting: Anesthesiology

## 2013-01-11 ENCOUNTER — Encounter (HOSPITAL_COMMUNITY)
Admission: RE | Disposition: A | Discharge status: Home / Self Care | Payer: Self-pay | Source: Ambulatory Visit | Attending: Obstetrics and Gynecology

## 2013-01-11 DIAGNOSIS — Z302 Encounter for sterilization: Secondary | ICD-10-CM | POA: Insufficient documentation

## 2013-01-11 HISTORY — PX: LAPAROSCOPIC BILATERAL SALPINGECTOMY: SHX5889

## 2013-01-11 LAB — CBC
HCT: 38.1 % (ref 36.0–46.0)
Hemoglobin: 12.9 g/dL (ref 12.0–15.0)
MCH: 25.9 pg — ABNORMAL LOW (ref 26.0–34.0)
MCHC: 33.9 g/dL (ref 30.0–36.0)
Platelets: 256 10*3/uL (ref 150–400)
RBC: 4.99 MIL/uL (ref 3.87–5.11)

## 2013-01-11 LAB — HCG, SERUM, QUALITATIVE: Preg, Serum: NEGATIVE

## 2013-01-11 SURGERY — SALPINGECTOMY, BILATERAL, LAPAROSCOPIC
Anesthesia: General | Site: Abdomen | Laterality: Bilateral | Wound class: Clean Contaminated

## 2013-01-11 MED ORDER — BUPIVACAINE HCL (PF) 0.25 % IJ SOLN
INTRAMUSCULAR | Status: DC | PRN
  Administered 2013-01-11: 17 mL

## 2013-01-11 MED ORDER — IBUPROFEN 600 MG PO TABS: 600.0000 mg | ORAL_TABLET | Freq: Four times a day (QID) | ORAL | Status: DC | PRN

## 2013-01-11 MED ORDER — NEOSTIGMINE METHYLSULFATE 1 MG/ML IJ SOLN
INTRAMUSCULAR | Status: DC | PRN
  Administered 2013-01-11: 3 mg via INTRAVENOUS

## 2013-01-11 MED ORDER — NEOSTIGMINE METHYLSULFATE 1 MG/ML IJ SOLN
INTRAMUSCULAR | Status: AC
  Filled 2013-01-11: qty 1

## 2013-01-11 MED ORDER — MEPERIDINE HCL 25 MG/ML IJ SOLN: 6.2500 mg | INTRAMUSCULAR | Status: DC | PRN

## 2013-01-11 MED ORDER — ONDANSETRON HCL 4 MG/2ML IJ SOLN
INTRAMUSCULAR | Status: AC
Start: 2013-01-11 — End: 2013-01-11
  Filled 2013-01-11: qty 2

## 2013-01-11 MED ORDER — MIDAZOLAM HCL 2 MG/2ML IJ SOLN
INTRAMUSCULAR | Status: DC | PRN
  Administered 2013-01-11: 2 mg via INTRAVENOUS

## 2013-01-11 MED ORDER — ONDANSETRON HCL 4 MG/2ML IJ SOLN: 4.0000 mg | Freq: Once | INTRAMUSCULAR | Status: DC | PRN

## 2013-01-11 MED ORDER — FENTANYL CITRATE 0.05 MG/ML IJ SOLN
25.0000 ug | INTRAMUSCULAR | Status: DC | PRN
  Administered 2013-01-11 (×2): 50 ug via INTRAVENOUS

## 2013-01-11 MED ORDER — KETOROLAC TROMETHAMINE 30 MG/ML IJ SOLN
INTRAMUSCULAR | Status: AC
  Filled 2013-01-11: qty 1

## 2013-01-11 MED ORDER — GLYCOPYRROLATE 0.2 MG/ML IJ SOLN
INTRAMUSCULAR | Status: DC | PRN
  Administered 2013-01-11: 0.4 mg via INTRAVENOUS
  Administered 2013-01-11: 0.2 mg via INTRAVENOUS

## 2013-01-11 MED ORDER — BUPIVACAINE HCL (PF) 0.25 % IJ SOLN
INTRAMUSCULAR | Status: AC
  Filled 2013-01-11: qty 30

## 2013-01-11 MED ORDER — FENTANYL CITRATE 0.05 MG/ML IJ SOLN
INTRAMUSCULAR | Status: DC | PRN
  Administered 2013-01-11: 50 ug via INTRAVENOUS
  Administered 2013-01-11: 100 ug via INTRAVENOUS
  Administered 2013-01-11: 50 ug via INTRAVENOUS

## 2013-01-11 MED ORDER — ONDANSETRON HCL 4 MG/2ML IJ SOLN
INTRAMUSCULAR | Status: DC | PRN
  Administered 2013-01-11: 4 mg via INTRAVENOUS

## 2013-01-11 MED ORDER — LIDOCAINE HCL (CARDIAC) 20 MG/ML IV SOLN
INTRAVENOUS | Status: AC
  Filled 2013-01-11: qty 5

## 2013-01-11 MED ORDER — BUPIVACAINE HCL (PF) 0.25 % IJ SOLN
INTRAMUSCULAR | Status: AC
  Filled 2013-01-11: qty 10

## 2013-01-11 MED ORDER — FENTANYL CITRATE 0.05 MG/ML IJ SOLN
INTRAMUSCULAR | Status: AC
  Administered 2013-01-11: 50 ug via INTRAVENOUS
  Filled 2013-01-11: qty 2

## 2013-01-11 MED ORDER — LIDOCAINE HCL (CARDIAC) 20 MG/ML IV SOLN
INTRAVENOUS | Status: DC | PRN
  Administered 2013-01-11: 50 mg via INTRAVENOUS

## 2013-01-11 MED ORDER — MIDAZOLAM HCL 2 MG/2ML IJ SOLN
INTRAMUSCULAR | Status: AC
Start: 2013-01-11 — End: 2013-01-11
  Filled 2013-01-11: qty 2

## 2013-01-11 MED ORDER — PROPOFOL 10 MG/ML IV BOLUS
INTRAVENOUS | Status: DC | PRN
  Administered 2013-01-11: 160 mg via INTRAVENOUS

## 2013-01-11 MED ORDER — KETOROLAC TROMETHAMINE 30 MG/ML IJ SOLN: 15.0000 mg | Freq: Once | INTRAMUSCULAR | Status: DC | PRN

## 2013-01-11 MED ORDER — ROCURONIUM BROMIDE 100 MG/10ML IV SOLN
INTRAVENOUS | Status: DC | PRN
  Administered 2013-01-11: 40 mg via INTRAVENOUS

## 2013-01-11 MED ORDER — ROCURONIUM BROMIDE 100 MG/10ML IV SOLN
INTRAVENOUS | Status: AC
  Filled 2013-01-11: qty 1

## 2013-01-11 MED ORDER — FENTANYL CITRATE 0.05 MG/ML IJ SOLN
INTRAMUSCULAR | Status: AC
  Filled 2013-01-11: qty 2

## 2013-01-11 MED ORDER — KETOROLAC TROMETHAMINE 30 MG/ML IJ SOLN
INTRAMUSCULAR | Status: DC | PRN
  Administered 2013-01-11: 30 mg via INTRAVENOUS

## 2013-01-11 MED ORDER — LACTATED RINGERS IV SOLN
INTRAVENOUS | Status: DC
  Administered 2013-01-11 (×2): via INTRAVENOUS

## 2013-01-11 MED ORDER — GLYCOPYRROLATE 0.2 MG/ML IJ SOLN
INTRAMUSCULAR | Status: AC
  Filled 2013-01-11: qty 3

## 2013-01-11 MED ORDER — OXYCODONE-ACETAMINOPHEN 5-325 MG PO TABS: 1.0000 | ORAL_TABLET | Freq: Four times a day (QID) | ORAL | Status: DC | PRN

## 2013-01-11 MED ORDER — PROPOFOL 10 MG/ML IV EMUL
INTRAVENOUS | Status: AC
  Filled 2013-01-11: qty 20

## 2013-01-11 SURGICAL SUPPLY — 32 items
ADH SKN CLS APL DERMABOND .7 (GAUZE/BANDAGES/DRESSINGS) ×1
BAG SPEC RTRVL LRG 6X4 10 (ENDOMECHANICALS)
CABLE HIGH FREQUENCY MONO STRZ (ELECTRODE) IMPLANT
CHLORAPREP W/TINT 26ML (MISCELLANEOUS) ×2 IMPLANT
CLOTH BEACON ORANGE TIMEOUT ST (SAFETY) ×4 IMPLANT
DERMABOND ADVANCED (GAUZE/BANDAGES/DRESSINGS) ×1
DERMABOND ADVANCED .7 DNX12 (GAUZE/BANDAGES/DRESSINGS) ×1 IMPLANT
FORCEPS CUTTING 33CM 5MM (CUTTING FORCEPS) IMPLANT
FORCEPS CUTTING 45CM 5MM (CUTTING FORCEPS) IMPLANT
GLOVE BIO SURGEON STRL SZ7.5 (GLOVE) ×2 IMPLANT
GLOVE BIOGEL PI IND STRL 7.5 (GLOVE) ×1 IMPLANT
GLOVE BIOGEL PI INDICATOR 7.5 (GLOVE) ×1
GOWN STRL REIN XL XLG (GOWN DISPOSABLE) ×2 IMPLANT
NEEDLE INSUFFLATION 120MM (ENDOMECHANICALS) IMPLANT
NS IRRIG 1000ML POUR BTL (IV SOLUTION) ×2 IMPLANT
PACK LAPAROSCOPY BASIN (CUSTOM PROCEDURE TRAY) ×2 IMPLANT
POUCH SPECIMEN RETRIEVAL 10MM (ENDOMECHANICALS) IMPLANT
PROTECTOR NERVE ULNAR (MISCELLANEOUS) ×2 IMPLANT
SET IRRIG TUBING LAPAROSCOPIC (IRRIGATION / IRRIGATOR) IMPLANT
SOLUTION ELECTROLUBE (MISCELLANEOUS) IMPLANT
SUT MON AB 4-0 PS1 27 (SUTURE) ×2 IMPLANT
SUT VICRYL 0 ENDOLOOP (SUTURE) IMPLANT
SUT VICRYL 0 TIES 12 18 (SUTURE) IMPLANT
SUT VICRYL 0 UR6 27IN ABS (SUTURE) ×2 IMPLANT
SYR 50ML LL SCALE MARK (SYRINGE) IMPLANT
TOWEL OR 17X24 6PK STRL BLUE (TOWEL DISPOSABLE) ×4 IMPLANT
TRAY FOLEY CATH 14FR (SET/KITS/TRAYS/PACK) ×2 IMPLANT
TROCAR XCEL NON-BLD 11X100MML (ENDOMECHANICALS) ×2 IMPLANT
TROCAR XCEL NON-BLD 5MMX100MML (ENDOMECHANICALS) ×2 IMPLANT
TROCAR XCEL OPT SLVE 5M 100M (ENDOMECHANICALS) IMPLANT
WARMER LAPAROSCOPE (MISCELLANEOUS) ×2 IMPLANT
WATER STERILE IRR 1000ML POUR (IV SOLUTION) IMPLANT

## 2013-01-11 NOTE — H&P (View-Only) (Signed)
  Teresa Freeman is a 37 y.o. female  P 3-0-0-3 for sterilization.  Patient has used Depo Provera in the past and currently is using oral contraceptives.  Her menses lasts for 5 days with pad change every 4 hours and minimal cramping (rated as 4/10 on a 10 point pain scale-no analgesia required).  Patient states that she wants to end her child-bearing potential and therefore wants to proceed with sterilization.   Past Medical History  OB History: G 3;  P 3-0-0-3  GYN History: menarche: 37YO   LMP: 12/30/12;  Contracepton: Oral Contraceptives; Denies history of STDs;   Remote history of abnormal PAP smear in 1999, repeated and normal since Last PAP smear: 11/2012  Medical History: GERD, migraine, right 5th toe fracture  Surgical History: 06/04/2012  Excision of Right Wrist Ganglion Cyst Denies problems with anesthesia or history of blood transfusions  Family History:   Diabetes mellitus, renal failure, hypertension, multiply myeloma, seizures, gastrointestinal cancer  Social History:  Married and Employed as a Runner, broadcasting/film/video with [employer]:  Denies alcohol, tobacco or illicit drug use.   Outpatient Encounter Prescriptions as of 12/31/2012  Medication Sig Dispense Refill  . Multiple Vitamins-Minerals (MULTIVITAMIN WITH MINERALS) tablet Take 1 tablet by mouth daily.      . norethindrone-ethinyl estradiol (JUNEL FE 1/20) 1-20 MG-MCG tablet Take 1 tablet by mouth daily.  1 Package  11   No facility-administered encounter medications on file as of 12/31/2012.    No Known Allergies  Denies sensitivity to peanuts, shellfish, soy, latex or adhesives.   ROS:  Denies corrective lenses, headache, vision changes, nasal congestion, dysphagia, tinnitus, dizziness, hoarseness, cough,  chest pain, shortness of breath, nausea, vomiting, diarrhea,constipation,  urinary frequency, urgency  dysuria, hematuria, vaginitis symptoms, pelvic pain, swelling of joints,easy bruising,  myalgias,  arthralgias, skin rashes, unexplained weight loss and except as is mentioned in the history of present illness, patient's review of systems is otherwise negative.   Physical Exam  Bp: 118/78     P: 68   R:  16   Temperature:   99.1 degrees F   Weight:  187.5 lbs.  Height: 5 ' 6.5 "  Neck: supple without masses or thyromegaly Lungs: clear to auscultation Heart: regular rate and rhythm Abdomen: soft, non-tender and no organomegaly Pelvic:EGBUS- wnl; vagina-normal rugae, small blood; uterus-normal size, cervix without lesions or motion tenderness; adnexae-no tenderness or masses Extremities:  no clubbing, cyanosis or edema   Assesment:  Desire for Sterilzation   Disposition:  A discussion was held with patient regarding the indication for her procedure(s) along with the risks, which include but are not limited to: reaction to anesthesia, damage to adjacent organs, infection, excessive bleeding and a pregnancy  failure rate of 2/500.  Patient verbalized understanding of these risks and has consented to proceed with Bilateral Salpingectomy with Possible Bilateral Fallopian Tube Fulguration at [facility],  January 11, 2013 at 2 p.m.   CSN# [id number]   Yaniv Lage J. Lowell Guitar, PA-C  for Dr. Woodroe Mode. Su Hilt

## 2013-01-11 NOTE — Interval H&P Note (Signed)
History and Physical Interval Note:  01/11/2013 2:55 PM  Teresa Freeman  has presented today for surgery, with the diagnosis of Desires Sterilization  The various methods of treatment have been discussed with the patient and family. After consideration of risks, benefits and other options for treatment, the patient has consented to  Procedure(s): LAPAROSCOPIC BILATERAL SALPINGECTOMY (Bilateral) as a surgical intervention .  The patient's history has been reviewed, patient examined, no change in status, stable for surgery.  I have reviewed the patient's chart and labs.  Questions were answered to the patient's satisfaction.     Purcell Nails

## 2013-01-11 NOTE — Anesthesia Preprocedure Evaluation (Signed)
Anesthesia Evaluation  Patient identified by MRN, date of birth, ID band Patient awake    Reviewed: Allergy & Precautions, H&P , NPO status , Patient's Chart, lab work & pertinent test results  Airway Mallampati: I TM Distance: >3 FB Neck ROM: full    Dental no notable dental hx. (+) Teeth Intact   Pulmonary neg pulmonary ROS,          Cardiovascular negative cardio ROS      Neuro/Psych negative neurological ROS  negative psych ROS   GI/Hepatic negative GI ROS, Neg liver ROS,   Endo/Other  negative endocrine ROS  Renal/GU negative Renal ROS  negative genitourinary   Musculoskeletal negative musculoskeletal ROS (+)   Abdominal Normal abdominal exam  (+)   Peds  Hematology negative hematology ROS (+)   Anesthesia Other Findings   Reproductive/Obstetrics negative OB ROS                           Anesthesia Physical Anesthesia Plan  ASA: II  Anesthesia Plan: General   Post-op Pain Management:    Induction: Intravenous  Airway Management Planned: Oral ETT  Additional Equipment:   Intra-op Plan:   Post-operative Plan: Extubation in OR  Informed Consent: I have reviewed the patients History and Physical, chart, labs and discussed the procedure including the risks, benefits and alternatives for the proposed anesthesia with the patient or authorized representative who has indicated his/her understanding and acceptance.   Dental Advisory Given  Plan Discussed with: CRNA and Surgeon  Anesthesia Plan Comments:         Anesthesia Quick Evaluation

## 2013-01-11 NOTE — Anesthesia Procedure Notes (Signed)
Procedure Name: Intubation Date/Time: 01/11/2013 3:18 PM Performed by: Shanon Payor Pre-anesthesia Checklist: Suction available, Emergency Drugs available, Timeout performed, Patient identified and Patient being monitored Patient Re-evaluated:Patient Re-evaluated prior to inductionOxygen Delivery Method: Circle system utilized Intubation Type: IV induction Ventilation: Mask ventilation without difficulty Laryngoscope Size: Mac and 3 Grade View: Grade I Tube type: Oral Tube size: 7.0 mm Number of attempts: 1 Airway Equipment and Method: Stylet Placement Confirmation: ETT inserted through vocal cords under direct vision,  positive ETCO2 and breath sounds checked- equal and bilateral Secured at: 22 cm Tube secured with: Tape Dental Injury: Teeth and Oropharynx as per pre-operative assessment

## 2013-01-11 NOTE — Anesthesia Postprocedure Evaluation (Signed)
Anesthesia Post Note  Patient: Teresa Freeman  Procedure(s) Performed: Procedure(s) (LRB): LAPAROSCOPIC BILATERAL SALPINGECTOMY (Bilateral)  Anesthesia type: General  Patient location: PACU  Post pain: Pain level controlled  Post assessment: Post-op Vital signs reviewed  Last Vitals:  Filed Vitals:   01/11/13 1645  BP: 140/97  Pulse: 80  Temp:   Resp: 16    Post vital signs: Reviewed  Level of consciousness: sedated  Complications: No apparent anesthesia complications

## 2013-01-11 NOTE — Transfer of Care (Signed)
Immediate Anesthesia Transfer of Care Note  Patient: Teresa Freeman  Procedure(s) Performed: Procedure(s): LAPAROSCOPIC BILATERAL SALPINGECTOMY (Bilateral)  Patient Location: PACU  Anesthesia Type:General  Level of Consciousness: awake, alert  and oriented  Airway & Oxygen Therapy: Patient Spontanous Breathing and Patient connected to nasal cannula oxygen  Post-op Assessment: Report given to PACU RN and Post -op Vital signs reviewed and stable  Post vital signs: Reviewed and stable  Complications: No apparent anesthesia complications

## 2013-01-11 NOTE — Op Note (Signed)
Preop Diagnosis: Desires Surgical Sterilzation  Postop Diagnosis: Desires Surgical Sterilzation  Procedure: LAPAROSCOPIC BILATERAL SALPINGECTOMY  Anesthesia: General   Attending: Purcell Nails, MD   Assistant:  N/a  Findings: Normal appearing bilateral ovaries and tubes  Pathology: Bilateral tubes  Fluids: 1200 cc  UOP: 50 cc  EBL: Minimal  Complications: None  Procedure: The patient was taken to the operating room after the risks, benefits, alternatives, complications, treatment options, and expected outcomes were discussed with the patient. The patient verbalized understanding, the patient concurred with the proposed plan and consent signed and witnessed. The patient was taken to the Operating Room, identified as Teresa Freeman  and procedure verified as sterilization procedure via laparoscopic bilateral salpingectomy. A Time Out was held and the above information confirmed.  The patient was placed under general anesthesia per anesthesia staff, the patient was placed in modified dorsal lithotomy position and was prepped, draped, and catheterized in the normal, sterile fashion.  The cervix was visualized and an intrauterine manipulator was placed. A  10 mm umbilical incision was then performed. Veress needle was passed and pneumoperitoneum was established. A 10 mm trocar was advanced into the intraabdominal cavity, the laparoscope was introduced and findings as noted above.  A 5mm incision was made suprapubically and 5mm trocar advanced into the intraabdominal cavity under direct visualization.  The same was done in the LLQ.  The right fallopian tube was identified and carried out to its fimbriated end and excised with the Gyrus tripolar.  The same was done on the contralateral side.  The laparoscope was removed and pneumoperitoneum released.  The fascia was repaired with an interrupted stitch of 0 vicryl and the skin was reapproximated with -0 monocryl via subcuticular stitch.   Dermabond was applied to close the 5mm incisions and used to reinforce the 10mm umbilical incision.  Sponge, lap and needle count were correct.  Patient tolerated the procedure well and was returned to the recovery room in good condition.

## 2013-01-14 ENCOUNTER — Encounter (HOSPITAL_COMMUNITY): Payer: Self-pay | Admitting: Obstetrics and Gynecology

## 2014-01-21 ENCOUNTER — Emergency Department (HOSPITAL_COMMUNITY)
Admission: EM | Admit: 2014-01-21 | Discharge: 2014-01-22 | Disposition: A | Discharge status: Home / Self Care | Payer: BC Managed Care – PPO | Attending: Emergency Medicine | Admitting: Emergency Medicine

## 2014-01-21 ENCOUNTER — Encounter (HOSPITAL_COMMUNITY): Payer: Self-pay | Admitting: Emergency Medicine

## 2014-01-21 ENCOUNTER — Emergency Department (HOSPITAL_COMMUNITY): Payer: BC Managed Care – PPO

## 2014-01-21 DIAGNOSIS — Z79899 Other long term (current) drug therapy: Secondary | ICD-10-CM | POA: Insufficient documentation

## 2014-01-21 DIAGNOSIS — D509 Iron deficiency anemia, unspecified: Secondary | ICD-10-CM | POA: Diagnosis not present

## 2014-01-21 DIAGNOSIS — Z8739 Personal history of other diseases of the musculoskeletal system and connective tissue: Secondary | ICD-10-CM | POA: Insufficient documentation

## 2014-01-21 DIAGNOSIS — K625 Hemorrhage of anus and rectum: Secondary | ICD-10-CM | POA: Diagnosis not present

## 2014-01-21 DIAGNOSIS — R100 Acute abdomen: Secondary | ICD-10-CM | POA: Insufficient documentation

## 2014-01-21 DIAGNOSIS — Z791 Long term (current) use of non-steroidal anti-inflammatories (NSAID): Secondary | ICD-10-CM | POA: Diagnosis not present

## 2014-01-21 DIAGNOSIS — Z793 Long term (current) use of hormonal contraceptives: Secondary | ICD-10-CM | POA: Insufficient documentation

## 2014-01-21 DIAGNOSIS — R109 Unspecified abdominal pain: Secondary | ICD-10-CM

## 2014-01-21 DIAGNOSIS — R197 Diarrhea, unspecified: Secondary | ICD-10-CM

## 2014-01-21 DIAGNOSIS — R1030 Lower abdominal pain, unspecified: Secondary | ICD-10-CM | POA: Diagnosis present

## 2014-01-21 LAB — URINALYSIS, ROUTINE W REFLEX MICROSCOPIC
Bilirubin Urine: NEGATIVE
Glucose, UA: NEGATIVE mg/dL
Hgb urine dipstick: NEGATIVE
Ketones, ur: NEGATIVE mg/dL
Nitrite: NEGATIVE
PROTEIN: NEGATIVE mg/dL
Specific Gravity, Urine: 1.012 (ref 1.005–1.030)
UROBILINOGEN UA: 0.2 mg/dL (ref 0.0–1.0)
pH: 6 (ref 5.0–8.0)

## 2014-01-21 LAB — COMPREHENSIVE METABOLIC PANEL
ALT: 30 U/L (ref 0–35)
AST: 32 U/L (ref 0–37)
Albumin: 3.8 g/dL (ref 3.5–5.2)
Alkaline Phosphatase: 66 U/L (ref 39–117)
Anion gap: 15 (ref 5–15)
BUN: 12 mg/dL (ref 6–23)
CALCIUM: 9.2 mg/dL (ref 8.4–10.5)
CHLORIDE: 102 meq/L (ref 96–112)
CO2: 21 meq/L (ref 19–32)
Creatinine, Ser: 0.6 mg/dL (ref 0.50–1.10)
GFR calc Af Amer: >90 mL/min (ref >90)
Glucose, Bld: 84 mg/dL (ref 70–99)
Potassium: 3.6 mEq/L — ABNORMAL LOW (ref 3.7–5.3)
Sodium: 138 mEq/L (ref 137–147)
Total Bilirubin: 0.5 mg/dL (ref 0.3–1.2)
Total Protein: 8 g/dL (ref 6.0–8.3)

## 2014-01-21 LAB — POC OCCULT BLOOD, ED: Fecal Occult Bld: POSITIVE — AB

## 2014-01-21 LAB — URINE MICROSCOPIC-ADD ON

## 2014-01-21 MED ORDER — IOHEXOL 300 MG/ML  SOLN: 100.0000 mL | Freq: Once | INTRAMUSCULAR | Status: AC | PRN

## 2014-01-21 MED ORDER — SODIUM CHLORIDE 0.9 % IV SOLN: INTRAVENOUS | Status: DC

## 2014-01-21 MED ORDER — SODIUM CHLORIDE 0.9 % IV BOLUS (SEPSIS)
1000.0000 mL | Freq: Once | INTRAVENOUS | Status: AC
  Administered 2014-01-21: 1000 mL via INTRAVENOUS
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
## 2014-01-21 NOTE — ED Notes (Signed)
Rectal exam performed by this RN; no hemorrhoids visualized or felt; bloody mucousy stool obtained upon digital exam; stool was bright red / pink in color;  Stool was positive for blood

## 2014-01-21 NOTE — ED Notes (Signed)
Pt alert, arrives from home, c/o gen abd pain, diarrhea, onset was last PM, denies recent illness or exposures, last BM was 1500 today, describes as loose, "blood in my undies", resp evn unlabored, skin pwd

## 2014-01-22 LAB — CBC WITH DIFFERENTIAL/PLATELET
Basophils Absolute: 0 10*3/uL (ref 0.0–0.1)
Basophils Relative: 0 % (ref 0–1)
EOS PCT: 3 % (ref 0–5)
Eosinophils Absolute: 0.2 10*3/uL (ref 0.0–0.7)
HEMATOCRIT: 34.8 % — AB (ref 36.0–46.0)
Hemoglobin: 11.7 g/dL — ABNORMAL LOW (ref 12.0–15.0)
LYMPHS ABS: 1.4 10*3/uL (ref 0.7–4.0)
Lymphocytes Relative: 19 % (ref 12–46)
MCH: 24.9 pg — AB (ref 26.0–34.0)
MCHC: 33.6 g/dL (ref 30.0–36.0)
MCV: 74 fL — AB (ref 78.0–100.0)
MONO ABS: 0.4 10*3/uL (ref 0.1–1.0)
Monocytes Relative: 6 % (ref 3–12)
Neutro Abs: 5.3 10*3/uL (ref 1.7–7.7)
Neutrophils Relative %: 72 % (ref 43–77)
PLATELETS: 237 10*3/uL (ref 150–400)
RBC: 4.7 MIL/uL (ref 3.87–5.11)
RDW: 13.4 % (ref 11.5–15.5)
WBC: 7.3 10*3/uL (ref 4.0–10.5)

## 2014-01-22 LAB — URINE CULTURE: Colony Count: >=100000

## 2014-01-22 MED ORDER — IOHEXOL 300 MG/ML  SOLN
100.0000 mL | Freq: Once | INTRAMUSCULAR | Status: AC | PRN
  Administered 2014-01-22: 100 mL via INTRAVENOUS

## 2014-01-22 NOTE — Discharge Instructions (Signed)
Abdominal Pain Many things can cause abdominal pain. Usually, abdominal pain is not caused by a disease and will improve without treatment. It can often be observed and treated at home. Your health care provider will do a physical exam and possibly order blood tests and X-rays to help determine the seriousness of your pain. However, in many cases, more time must pass before a clear cause of the pain can be found. Before that point, your health care provider may not know if you need more testing or further treatment. HOME CARE INSTRUCTIONS  Monitor your abdominal pain for any changes. The following actions may help to alleviate any discomfort you are experiencing:  Only take over-the-counter or prescription medicines as directed by your health care provider.  Do not take laxatives unless directed to do so by your health care provider.  Try a clear liquid diet (broth, tea, or water) as directed by your health care provider. Slowly move to a bland diet as tolerated. SEEK MEDICAL CARE IF:  You have unexplained abdominal pain.  You have abdominal pain associated with nausea or diarrhea.  You have pain when you urinate or have a bowel movement.  You experience abdominal pain that wakes you in the night.  You have abdominal pain that is worsened or improved by eating food.  You have abdominal pain that is worsened with eating fatty foods.  You have a fever. SEEK IMMEDIATE MEDICAL CARE IF:   Your pain does not go away within 2 hours.  You keep throwing up (vomiting).  Your pain is felt only in portions of the abdomen, such as the right side or the left lower portion of the abdomen.  You pass bloody or black tarry stools. MAKE SURE YOU:  Understand these instructions.   Will watch your condition.   Will get help right away if you are not doing well or get worse.  Document Released: 12/08/2004 Document Revised: 03/05/2013 Document Reviewed: 11/07/2012 [facility] Patient Information  2015 [place], [place]. This information is not intended to replace advice given to you by your health care provider. Make sure you discuss any questions you have with your health care provider.  Anemia, Nonspecific Anemia is a condition in which the concentration of red blood cells or hemoglobin in the blood is below normal. Hemoglobin is a substance in red blood cells that carries oxygen to the tissues of the body. Anemia results in not enough oxygen reaching these tissues.  CAUSES  Common causes of anemia include:   Excessive bleeding. Bleeding may be internal or external. This includes excessive bleeding from periods (in women) or from the intestine.   Poor nutrition.   Chronic kidney, thyroid, and liver disease.  Bone marrow disorders that decrease red blood cell production.  Cancer and treatments for cancer.  HIV, AIDS, and their treatments.  Spleen problems that increase red blood cell destruction.  Blood disorders.  Excess destruction of red blood cells due to infection, medicines, and autoimmune disorders. SIGNS AND SYMPTOMS   Minor weakness.   Dizziness.   Headache.  Palpitations.   Shortness of breath, especially with exercise.   Paleness.  Cold sensitivity.  Indigestion.  Nausea.  Difficulty sleeping.  Difficulty concentrating. Symptoms may occur suddenly or they may develop slowly.  DIAGNOSIS  Additional blood tests are often needed. These help your health care provider determine the best treatment. Your health care provider will check your stool for blood and look for other causes of blood loss.  TREATMENT  Treatment varies depending  on the cause of the anemia. Treatment can include:   Supplements of iron, vitamin J33, or folic acid.   Hormone medicines.   A blood transfusion. This may be needed if blood loss is severe.   Hospitalization. This may be needed if there is significant continual blood loss.   Dietary changes.  Spleen  removal. HOME CARE INSTRUCTIONS Keep all follow-up appointments. It often takes many weeks to correct anemia, and having your health care provider check on your condition and your response to treatment is very important. SEEK IMMEDIATE MEDICAL CARE IF:   You develop extreme weakness, shortness of breath, or chest pain.   You become dizzy or have trouble concentrating.  You develop heavy vaginal bleeding.   You develop a rash.   You have bloody or black, tarry stools.   You faint.   You vomit up blood.   You vomit repeatedly.   You have abdominal pain.  You have a fever or persistent symptoms for more than 2-3 days.   You have a fever and your symptoms suddenly get worse.   You are dehydrated.  MAKE SURE YOU:  Understand these instructions.  Will watch your condition.  Will get help right away if you are not doing well or get worse. Document Released: 04/07/2004 Document Revised: 10/31/2012 Document Reviewed: 08/24/2012 [facility] Patient Information 2015 [place], [place]. This information is not intended to replace advice given to you by your health care provider. Make sure you discuss any questions you have with your health care provider.  Diarrhea Diarrhea is frequent loose and watery bowel movements. It can cause you to feel weak and dehydrated. Dehydration can cause you to become tired and thirsty, have a dry mouth, and have decreased urination that often is dark yellow. Diarrhea is a sign of another problem, most often an infection that will not last long. In most cases, diarrhea typically lasts 2-3 days. However, it can last longer if it is a sign of something more serious. It is important to treat your diarrhea as directed by your caregiver to lessen or prevent future episodes of diarrhea. CAUSES  Some common causes include:  Gastrointestinal infections caused by viruses, bacteria, or parasites.  Food poisoning or food allergies.  Certain medicines, such  as antibiotics, chemotherapy, and laxatives.  Artificial sweeteners and fructose.  Digestive disorders. HOME CARE INSTRUCTIONS  Ensure adequate fluid intake (hydration): Have 1 cup (8 oz) of fluid for each diarrhea episode. Avoid fluids that contain simple sugars or sports drinks, fruit juices, whole milk products, and sodas. Your urine should be clear or pale yellow if you are drinking enough fluids. Hydrate with an oral rehydration solution that you can purchase at pharmacies, retail stores, and online. You can prepare an oral rehydration solution at home by mixing the following ingredients together:   - tsp table salt.   tsp baking soda.   tsp salt substitute containing potassium chloride.  1  tablespoons sugar.  1 L (34 oz) of water.  Certain foods and beverages may increase the speed at which food moves through the gastrointestinal (GI) tract. These foods and beverages should be avoided and include:  Caffeinated and alcoholic beverages.  High-fiber foods, such as raw fruits and vegetables, nuts, seeds, and whole grain breads and cereals.  Foods and beverages sweetened with sugar alcohols, such as xylitol, sorbitol, and mannitol.  Some foods may be well tolerated and may help thicken stool including:  Starchy foods, such as rice, toast, pasta, low-sugar cereal, oatmeal, grits,  baked potatoes, crackers, and bagels.  Bananas.  Applesauce.  Add probiotic-rich foods to help increase healthy bacteria in the GI tract, such as yogurt and fermented milk products.  Wash your hands well after each diarrhea episode.  Only take over-the-counter or prescription medicines as directed by your caregiver.  Take a warm bath to relieve any burning or pain from frequent diarrhea episodes. SEEK IMMEDIATE MEDICAL CARE IF:   You are unable to keep fluids down.  You have persistent vomiting.  You have blood in your stool, or your stools are black and tarry.  You do not urinate in 6-8  hours, or there is only a small amount of very dark urine.  You have abdominal pain that increases or localizes.  You have weakness, dizziness, confusion, or light-headedness.  You have a severe headache.  Your diarrhea gets worse or does not get better.  You have a fever or persistent symptoms for more than 2-3 days.  You have a fever and your symptoms suddenly get worse. MAKE SURE YOU:   Understand these instructions.  Will watch your condition.  Will get help right away if you are not doing well or get worse. Document Released: 02/18/2002 Document Revised: 07/15/2013 Document Reviewed: 11/06/2011 [facility] Patient Information 2015 [place], [place]. This information is not intended to replace advice given to you by your health care provider. Make sure you discuss any questions you have with your health care provider.

## 2014-01-23 ENCOUNTER — Other Ambulatory Visit: Payer: Self-pay | Admitting: Obstetrics and Gynecology

## 2014-01-23 DIAGNOSIS — N63 Unspecified lump in unspecified breast: Secondary | ICD-10-CM

## 2014-01-31 ENCOUNTER — Ambulatory Visit
Admission: RE | Admit: 2014-01-31 | Discharge: 2014-01-31 | Disposition: A | Discharge status: Home / Self Care | Payer: BC Managed Care – PPO | Source: Ambulatory Visit | Attending: Obstetrics and Gynecology | Admitting: Obstetrics and Gynecology

## 2014-01-31 ENCOUNTER — Other Ambulatory Visit: Payer: Self-pay | Admitting: Obstetrics and Gynecology

## 2014-01-31 DIAGNOSIS — N63 Unspecified lump in unspecified breast: Secondary | ICD-10-CM

## 2014-07-22 ENCOUNTER — Other Ambulatory Visit: Payer: Self-pay | Admitting: Obstetrics and Gynecology

## 2014-07-22 DIAGNOSIS — Z1231 Encounter for screening mammogram for malignant neoplasm of breast: Secondary | ICD-10-CM

## 2014-07-22 DIAGNOSIS — N63 Unspecified lump in unspecified breast: Secondary | ICD-10-CM

## 2014-08-25 ENCOUNTER — Other Ambulatory Visit: Payer: Self-pay | Admitting: Obstetrics and Gynecology

## 2014-08-25 ENCOUNTER — Ambulatory Visit
Admission: RE | Admit: 2014-08-25 | Discharge: 2014-08-25 | Disposition: A | Discharge status: Home / Self Care | Payer: BC Managed Care – PPO | Source: Ambulatory Visit | Attending: Obstetrics and Gynecology | Admitting: Obstetrics and Gynecology

## 2014-08-25 DIAGNOSIS — N63 Unspecified lump in unspecified breast: Secondary | ICD-10-CM

## 2015-04-15 ENCOUNTER — Other Ambulatory Visit: Payer: Self-pay | Admitting: Obstetrics and Gynecology

## 2015-05-15 ENCOUNTER — Other Ambulatory Visit: Payer: Self-pay | Admitting: Obstetrics and Gynecology

## 2015-08-06 ENCOUNTER — Other Ambulatory Visit: Payer: Self-pay | Admitting: Obstetrics and Gynecology

## 2015-08-19 NOTE — Patient Instructions (Signed)
Your procedure is scheduled on:  Monday, August 24, 2015  Enter through the Main Entrance of [facility] at:  7:30 AM  Pick up the phone at the desk and dial [phone].  Call this number if you have problems the morning of surgery: [phone].  Remember: Do NOT eat food or drink after:  Midnight Sunday  Take these medicines the morning of surgery with a SIP OF WATER:  Hydrochlorothiazide  Do NOT wear jewelry (body piercing), metal hair clips/bobby pins, make-up, or nail polish. Do NOT wear lotions, powders, or perfumes.  You may wear deodorant. Do NOT shave for 48 hours prior to surgery. Do NOT bring valuables to the hospital. Contacts, dentures, or bridgework may not be worn into surgery.  Leave suitcase in car.  After surgery it may be brought to your room.  For patients admitted to the hospital, checkout time is 11:00 AM the day of discharge.

## 2015-08-20 ENCOUNTER — Encounter (HOSPITAL_COMMUNITY)
Admission: RE | Admit: 2015-08-20 | Discharge: 2015-08-20 | Disposition: A | Discharge status: Home / Self Care | Payer: BC Managed Care – PPO | Source: Ambulatory Visit | Attending: Obstetrics and Gynecology | Admitting: Obstetrics and Gynecology

## 2015-08-20 ENCOUNTER — Encounter (HOSPITAL_COMMUNITY): Payer: Self-pay

## 2015-08-20 DIAGNOSIS — Z01812 Encounter for preprocedural laboratory examination: Secondary | ICD-10-CM | POA: Insufficient documentation

## 2015-08-20 HISTORY — DX: Essential (primary) hypertension: I10

## 2015-08-20 HISTORY — DX: Anemia, unspecified: D64.9

## 2015-08-20 LAB — BASIC METABOLIC PANEL
Anion gap: 6 (ref 5–15)
BUN: 14 mg/dL (ref 6–20)
CALCIUM: 9.1 mg/dL (ref 8.9–10.3)
CO2: 27 mmol/L (ref 22–32)
CREATININE: 0.8 mg/dL (ref 0.44–1.00)
Chloride: 103 mmol/L (ref 101–111)
GFR calc Af Amer: >60 mL/min (ref >60)
GLUCOSE: 86 mg/dL (ref 65–99)
POTASSIUM: 3.8 mmol/L (ref 3.5–5.1)
SODIUM: 136 mmol/L (ref 135–145)

## 2015-08-20 LAB — CBC
HCT: 35.9 % — ABNORMAL LOW (ref 36.0–46.0)
Hemoglobin: 12.2 g/dL (ref 12.0–15.0)
MCH: 25.5 pg — ABNORMAL LOW (ref 26.0–34.0)
MCHC: 34 g/dL (ref 30.0–36.0)
MCV: 75.1 fL — ABNORMAL LOW (ref 78.0–100.0)
PLATELETS: 254 10*3/uL (ref 150–400)
RBC: 4.78 MIL/uL (ref 3.87–5.11)
RDW: 15 % (ref 11.5–15.5)
WBC: 6.2 10*3/uL (ref 4.0–10.5)

## 2015-08-21 ENCOUNTER — Other Ambulatory Visit (HOSPITAL_COMMUNITY): Payer: Self-pay | Admitting: Obstetrics and Gynecology

## 2015-08-21 NOTE — H&P (Signed)
Teresa Freeman is a 40 y.o. , P: 3-0-0-3 who presents for hysterectomy because of menorrhagia.  The patient underwent tubal sterilization in 2014 and shortly thereafter began to experience heavy, prolong menstrual periods. Her flow would last up to 14 days with pad change, on  most days, every  2 hours.  She reports cramping that she rated 6/10 on a 10 point pain scale but relief was achieved with Ibuprofen 600 mg.  Lysteda was prescribed for her  bleeding volume but with no relief.  Her hemoglobin dropped as low as 8.6  (most recent hemoglobin was 12.2)   but her TSH was normal.   A pelvic ultrasound in February 2017 showed a  multiple fibroid uterus with the # 3 largest:  submucosal vs intracavitary: 3.8 x 2.5 x 3.0 cm,  anterior intramural: 1.6 x 1.6 x 1.7 cm and anterior sub-serosal: 1.8 x 1.4 x 1.8 cm; with both ovaries appearing normal. In March 2017 the patient underwent hysteroscopic resection of the endometrial mass  seen on ultrasound and an endometrial ablation.  The pathology of the endometrial curettings  showed: benign smooth muscle, consistent with leiomyomas,; proliferative endometrium, benign endometrial polyp and no hyperplasia or malignancy.  Following that procedure the patient developed spotting for 14 days which spontaneously abated until May when she began to have a significant flow requiring a pad change every 1.5 hours.  Fortunately this bleeding responded to Provera 10 mg daily. The patient denies any changes in bowel or bladder function or dyspareunia.  She does admit to some post coital bleeding.  After reviewing both medical and surgical management options for treatment of her symptoms,  the patient has decided to proceed with definitive therapy in the form of hysterectomy.   Past Medical History  OB History: G: 3;   P: 3-0-0-3;  SVB 2004 and 2010 (largest infant 8 lbs. 8 oz.);  C-section: 2007   GYN History: menarche: 41 YO    LMP: see HPI (continuous bleeding)    Contracepton  bilateral tubal ligation  The patient denies history of sexually transmitted disease.  Remote  history of abnormal PAP smear-no treatment and normal since.   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   ROS: Denies corrective lenses,  headache, vision changes, nasal congestion, dysphagia, tinnitus, dizziness, hoarseness, cough,  chest pain, shortness of breath, nausea, vomiting, diarrhea,constipation,  urinary frequency, urgency  dysuria, hematuria, vaginitis symptoms, pelvic pain, swelling of joints,easy bruising,  myalgias, arthralgias, skin rashes, unexplained weight loss and except as is mentioned in the history of present illness, patient's review of systems is otherwise negative.    Physical Exam  Bp: 110/76   P: 76   R: 18   Temperature:  99.2 degrees F orally  Weight: 191 lbs.  Height: 5'6" BMI: 30.8  Neck: supple without masses or thyromegaly Lungs: clear  to auscultation Heart: regular rate and rhythm Abdomen: soft, non-tender and no organomegaly Pelvic:EGBUS- wnl; vagina-normal rugae; uterus-normal size, cervix without lesions or motion tenderness; adnexae-no tenderness or masses Extremities:  no clubbing, cyanosis or edema   Assesment:  Menorrhagia             Uterine  Fibroids             History of Anemia   Disposition:  A discussion was held with patient regarding the indication for her procedure(s) along with the risks, which include but are not limited to: reaction to anesthesia, damage to adjacent organs, infection and excessive bleeding.  The patient verbalized understanding of these risks and has consented to proceed with a Total Vaginal Hysterectomy, Bilateral Salpingectomy, Cystoscopy, Possible Laparoscopically Assisted Vaginal Hysterectomy and Possible Total Abdominal Hysterectomy at [place] on August 24, 2015.   CSN# [id number]   Anajulia Leyendecker J. Florene Glen, PA-C  for Dr. Harvie Bridge. Mancel Bale

## 2015-08-24 ENCOUNTER — Observation Stay (HOSPITAL_COMMUNITY)
Admission: RE | Admit: 2015-08-24 | Discharge: 2015-08-25 | Disposition: A | Discharge status: Home / Self Care | Payer: BC Managed Care – PPO | Source: Ambulatory Visit | Attending: Obstetrics and Gynecology | Admitting: Obstetrics and Gynecology

## 2015-08-24 ENCOUNTER — Ambulatory Visit (HOSPITAL_COMMUNITY): Payer: BC Managed Care – PPO | Admitting: Anesthesiology

## 2015-08-24 ENCOUNTER — Encounter (HOSPITAL_COMMUNITY)
Admission: RE | Disposition: A | Discharge status: Home / Self Care | Payer: Self-pay | Source: Ambulatory Visit | Attending: Obstetrics and Gynecology

## 2015-08-24 ENCOUNTER — Encounter (HOSPITAL_COMMUNITY): Payer: Self-pay

## 2015-08-24 DIAGNOSIS — D252 Subserosal leiomyoma of uterus: Secondary | ICD-10-CM | POA: Insufficient documentation

## 2015-08-24 DIAGNOSIS — D649 Anemia, unspecified: Secondary | ICD-10-CM | POA: Insufficient documentation

## 2015-08-24 DIAGNOSIS — D250 Submucous leiomyoma of uterus: Secondary | ICD-10-CM | POA: Insufficient documentation

## 2015-08-24 DIAGNOSIS — N888 Other specified noninflammatory disorders of cervix uteri: Secondary | ICD-10-CM | POA: Insufficient documentation

## 2015-08-24 DIAGNOSIS — K219 Gastro-esophageal reflux disease without esophagitis: Secondary | ICD-10-CM | POA: Diagnosis not present

## 2015-08-24 DIAGNOSIS — N920 Excessive and frequent menstruation with regular cycle: Secondary | ICD-10-CM | POA: Diagnosis not present

## 2015-08-24 DIAGNOSIS — N8302 Follicular cyst of left ovary: Secondary | ICD-10-CM | POA: Diagnosis not present

## 2015-08-24 DIAGNOSIS — D251 Intramural leiomyoma of uterus: Secondary | ICD-10-CM | POA: Insufficient documentation

## 2015-08-24 DIAGNOSIS — I10 Essential (primary) hypertension: Secondary | ICD-10-CM | POA: Insufficient documentation

## 2015-08-24 HISTORY — PX: VAGINAL HYSTERECTOMY: SHX2639

## 2015-08-24 HISTORY — PX: CYSTOSCOPY: SHX5120

## 2015-08-24 LAB — PREGNANCY, URINE: Preg Test, Ur: NEGATIVE

## 2015-08-24 SURGERY — HYSTERECTOMY, VAGINAL
Anesthesia: General

## 2015-08-24 MED ORDER — KETOROLAC TROMETHAMINE 30 MG/ML IJ SOLN
INTRAMUSCULAR | Status: DC | PRN
  Administered 2015-08-24: 30 mg via INTRAVENOUS

## 2015-08-24 MED ORDER — MIDAZOLAM HCL 2 MG/2ML IJ SOLN
INTRAMUSCULAR | Status: AC
  Filled 2015-08-24: qty 2

## 2015-08-24 MED ORDER — HYDROMORPHONE HCL 1 MG/ML IJ SOLN
INTRAMUSCULAR | Status: AC
  Filled 2015-08-24: qty 1

## 2015-08-24 MED ORDER — NALOXONE HCL 0.4 MG/ML IJ SOLN: 0.4000 mg | INTRAMUSCULAR | Status: DC | PRN

## 2015-08-24 MED ORDER — PROPOFOL 10 MG/ML IV BOLUS
INTRAVENOUS | Status: AC
  Filled 2015-08-24: qty 20

## 2015-08-24 MED ORDER — LIDOCAINE HCL (CARDIAC) 20 MG/ML IV SOLN
INTRAVENOUS | Status: DC | PRN
  Administered 2015-08-24: 80 mg via INTRAVENOUS

## 2015-08-24 MED ORDER — MEPERIDINE HCL 25 MG/ML IJ SOLN
12.5000 mg | Freq: Once | INTRAMUSCULAR | Status: AC
  Administered 2015-08-24: 12.5 mg via INTRAVENOUS

## 2015-08-24 MED ORDER — SODIUM CHLORIDE 0.9 % IV SOLN
INTRAVENOUS | Status: DC | PRN
  Administered 2015-08-24: 17 mL via INTRAMUSCULAR

## 2015-08-24 MED ORDER — DOCUSATE SODIUM 100 MG PO CAPS
100.0000 mg | ORAL_CAPSULE | Freq: Two times a day (BID) | ORAL | Status: DC
  Administered 2015-08-24 – 2015-08-25 (×2): 100 mg via ORAL
  Filled 2015-08-24 (×2): qty 1

## 2015-08-24 MED ORDER — ROCURONIUM BROMIDE 100 MG/10ML IV SOLN
INTRAVENOUS | Status: DC | PRN
  Administered 2015-08-24: 50 mg via INTRAVENOUS
  Administered 2015-08-24: 10 mg via INTRAVENOUS

## 2015-08-24 MED ORDER — DEXAMETHASONE SODIUM PHOSPHATE 4 MG/ML IJ SOLN
INTRAMUSCULAR | Status: AC
  Filled 2015-08-24: qty 1

## 2015-08-24 MED ORDER — SUGAMMADEX SODIUM 200 MG/2ML IV SOLN
INTRAVENOUS | Status: DC | PRN
  Administered 2015-08-24: 180 mg via INTRAVENOUS

## 2015-08-24 MED ORDER — ONDANSETRON HCL 4 MG PO TABS: 4.0000 mg | ORAL_TABLET | Freq: Three times a day (TID) | ORAL | Status: DC | PRN

## 2015-08-24 MED ORDER — KETOROLAC TROMETHAMINE 30 MG/ML IJ SOLN
30.0000 mg | Freq: Four times a day (QID) | INTRAMUSCULAR | Status: DC
  Administered 2015-08-24 – 2015-08-25 (×3): 30 mg via INTRAVENOUS
  Filled 2015-08-24 (×3): qty 1

## 2015-08-24 MED ORDER — OXYCODONE HCL 5 MG/5ML PO SOLN: 5.0000 mg | Freq: Once | ORAL | Status: DC | PRN

## 2015-08-24 MED ORDER — STERILE WATER FOR IRRIGATION IR SOLN
Status: DC | PRN
  Administered 2015-08-24: 1000 mL

## 2015-08-24 MED ORDER — FENTANYL CITRATE (PF) 100 MCG/2ML IJ SOLN
INTRAMUSCULAR | Status: DC | PRN
  Administered 2015-08-24: 25 ug via INTRAVENOUS
  Administered 2015-08-24 (×4): 50 ug via INTRAVENOUS
  Administered 2015-08-24: 100 ug via INTRAVENOUS
  Administered 2015-08-24: 25 ug via INTRAVENOUS

## 2015-08-24 MED ORDER — MIDAZOLAM HCL 2 MG/2ML IJ SOLN
INTRAMUSCULAR | Status: DC | PRN
  Administered 2015-08-24: 2 mg via INTRAVENOUS

## 2015-08-24 MED ORDER — ROCURONIUM BROMIDE 100 MG/10ML IV SOLN
INTRAVENOUS | Status: AC
  Filled 2015-08-24: qty 1

## 2015-08-24 MED ORDER — ONDANSETRON HCL 4 MG/2ML IJ SOLN
4.0000 mg | Freq: Once | INTRAMUSCULAR | Status: DC | PRN
Start: 2015-08-24 — End: 2015-08-24

## 2015-08-24 MED ORDER — ONDANSETRON HCL 4 MG/2ML IJ SOLN
INTRAMUSCULAR | Status: DC | PRN
  Administered 2015-08-24: 4 mg via INTRAVENOUS

## 2015-08-24 MED ORDER — ONDANSETRON HCL 4 MG/2ML IJ SOLN
INTRAMUSCULAR | Status: AC
  Filled 2015-08-24: qty 2

## 2015-08-24 MED ORDER — FENTANYL CITRATE (PF) 250 MCG/5ML IJ SOLN
INTRAMUSCULAR | Status: AC
  Filled 2015-08-24: qty 5

## 2015-08-24 MED ORDER — VASOPRESSIN 20 UNIT/ML IV SOLN
INTRAVENOUS | Status: AC
  Filled 2015-08-24: qty 1

## 2015-08-24 MED ORDER — SCOPOLAMINE 1 MG/3DAYS TD PT72
1.0000 | MEDICATED_PATCH | Freq: Once | TRANSDERMAL | Status: DC
  Administered 2015-08-24: 1.5 mg via TRANSDERMAL

## 2015-08-24 MED ORDER — LIDOCAINE HCL (CARDIAC) 20 MG/ML IV SOLN
INTRAVENOUS | Status: AC
  Filled 2015-08-24: qty 5

## 2015-08-24 MED ORDER — MENTHOL 3 MG MT LOZG: 1.0000 | LOZENGE | OROMUCOSAL | Status: DC | PRN

## 2015-08-24 MED ORDER — METHYLENE BLUE 1 % INJ SOLN
INTRAMUSCULAR | Status: AC
  Filled 2015-08-24: qty 10

## 2015-08-24 MED ORDER — SODIUM CHLORIDE 0.9 % IJ SOLN
INTRAMUSCULAR | Status: AC
  Filled 2015-08-24: qty 50

## 2015-08-24 MED ORDER — SODIUM CHLORIDE 0.9% FLUSH: 9.0000 mL | INTRAVENOUS | Status: DC | PRN

## 2015-08-24 MED ORDER — HYDROMORPHONE 1 MG/ML IV SOLN
INTRAVENOUS | Status: DC
  Administered 2015-08-24: 2.2 mg via INTRAVENOUS
  Administered 2015-08-24: 13:00:00 via INTRAVENOUS
  Administered 2015-08-25: 0.4 mg via INTRAVENOUS
  Administered 2015-08-25 (×2): 0.2 mg via INTRAVENOUS
  Filled 2015-08-24: qty 25

## 2015-08-24 MED ORDER — ESTRADIOL 0.1 MG/GM VA CREA
TOPICAL_CREAM | VAGINAL | Status: AC
  Filled 2015-08-24: qty 42.5

## 2015-08-24 MED ORDER — HYDROMORPHONE HCL 1 MG/ML IJ SOLN
INTRAMUSCULAR | Status: AC
  Administered 2015-08-24: 0.5 mg via INTRAVENOUS
  Filled 2015-08-24: qty 1

## 2015-08-24 MED ORDER — HYDROMORPHONE HCL 1 MG/ML IJ SOLN
INTRAMUSCULAR | Status: DC | PRN
  Administered 2015-08-24 (×2): 0.5 mg via INTRAVENOUS

## 2015-08-24 MED ORDER — DEXAMETHASONE SODIUM PHOSPHATE 10 MG/ML IJ SOLN
INTRAMUSCULAR | Status: DC | PRN
  Administered 2015-08-24: 4 mg via INTRAVENOUS

## 2015-08-24 MED ORDER — KETOROLAC TROMETHAMINE 30 MG/ML IJ SOLN
INTRAMUSCULAR | Status: AC
  Filled 2015-08-24: qty 1

## 2015-08-24 MED ORDER — OXYCODONE-ACETAMINOPHEN 5-325 MG PO TABS: 1.0000 | ORAL_TABLET | ORAL | Status: DC | PRN

## 2015-08-24 MED ORDER — IBUPROFEN 600 MG PO TABS: 600.0000 mg | ORAL_TABLET | Freq: Four times a day (QID) | ORAL | Status: DC | PRN

## 2015-08-24 MED ORDER — ONDANSETRON HCL 4 MG/2ML IJ SOLN: 4.0000 mg | Freq: Four times a day (QID) | INTRAMUSCULAR | Status: DC | PRN

## 2015-08-24 MED ORDER — LACTATED RINGERS IV SOLN
INTRAVENOUS | Status: DC
  Administered 2015-08-24: 125 mL/h via INTRAVENOUS
  Administered 2015-08-24: 09:00:00 via INTRAVENOUS

## 2015-08-24 MED ORDER — MEPERIDINE HCL 25 MG/ML IJ SOLN
INTRAMUSCULAR | Status: AC
  Administered 2015-08-24: 12.5 mg via INTRAVENOUS
  Filled 2015-08-24: qty 1

## 2015-08-24 MED ORDER — DIPHENHYDRAMINE HCL 50 MG/ML IJ SOLN
12.5000 mg | Freq: Four times a day (QID) | INTRAMUSCULAR | Status: DC | PRN
Start: 2015-08-24 — End: 2015-08-25

## 2015-08-24 MED ORDER — LACTATED RINGERS IV SOLN
INTRAVENOUS | Status: DC
  Administered 2015-08-24 (×2): via INTRAVENOUS

## 2015-08-24 MED ORDER — PROPOFOL 10 MG/ML IV BOLUS
INTRAVENOUS | Status: DC | PRN
  Administered 2015-08-24: 170 mg via INTRAVENOUS

## 2015-08-24 MED ORDER — SCOPOLAMINE 1 MG/3DAYS TD PT72
MEDICATED_PATCH | TRANSDERMAL | Status: AC
  Administered 2015-08-24: 1.5 mg via TRANSDERMAL
  Filled 2015-08-24: qty 1

## 2015-08-24 MED ORDER — FENTANYL CITRATE (PF) 100 MCG/2ML IJ SOLN
INTRAMUSCULAR | Status: AC
  Filled 2015-08-24: qty 2

## 2015-08-24 MED ORDER — SUGAMMADEX SODIUM 200 MG/2ML IV SOLN
INTRAVENOUS | Status: AC
  Filled 2015-08-24: qty 2

## 2015-08-24 MED ORDER — DIPHENHYDRAMINE HCL 12.5 MG/5ML PO ELIX: 12.5000 mg | ORAL_SOLUTION | Freq: Four times a day (QID) | ORAL | Status: DC | PRN

## 2015-08-24 MED ORDER — CEFAZOLIN SODIUM-DEXTROSE 2-4 GM/100ML-% IV SOLN
2.0000 g | INTRAVENOUS | Status: AC
  Administered 2015-08-24: 2 g via INTRAVENOUS

## 2015-08-24 MED ORDER — HYDROMORPHONE HCL 1 MG/ML IJ SOLN
0.2500 mg | INTRAMUSCULAR | Status: DC | PRN
  Administered 2015-08-24 (×3): 0.5 mg via INTRAVENOUS

## 2015-08-24 MED ORDER — OXYCODONE HCL 5 MG PO TABS: 5.0000 mg | ORAL_TABLET | Freq: Once | ORAL | Status: DC | PRN

## 2015-08-24 MED ORDER — HYDROCHLOROTHIAZIDE 12.5 MG PO CAPS
12.5000 mg | ORAL_CAPSULE | Freq: Every day | ORAL | Status: DC
  Administered 2015-08-25: 12.5 mg via ORAL
  Filled 2015-08-24 (×2): qty 1

## 2015-08-24 SURGICAL SUPPLY — 33 items
CANISTER SUCT 3000ML (MISCELLANEOUS) ×3 IMPLANT
CLOTH BEACON ORANGE TIMEOUT ST (SAFETY) ×3 IMPLANT
CONT PATH 16OZ SNAP LID 3702 (MISCELLANEOUS) ×3 IMPLANT
DECANTER SPIKE VIAL GLASS SM (MISCELLANEOUS) ×3 IMPLANT
DRAPE SHEET LG 3/4 BI-LAMINATE (DRAPES) ×6 IMPLANT
DRAPE STERI URO 9X17 APER PCH (DRAPES) ×3 IMPLANT
DRSG TELFA 3X8 NADH (GAUZE/BANDAGES/DRESSINGS) ×3 IMPLANT
GLOVE BIO SURGEON STRL SZ7.5 (GLOVE) ×3 IMPLANT
GLOVE BIOGEL PI IND STRL 6.5 (GLOVE) ×1 IMPLANT
GLOVE BIOGEL PI IND STRL 7.0 (GLOVE) ×1 IMPLANT
GLOVE BIOGEL PI IND STRL 7.5 (GLOVE) ×2 IMPLANT
GLOVE BIOGEL PI INDICATOR 6.5 (GLOVE) ×2
GLOVE BIOGEL PI INDICATOR 7.0 (GLOVE) ×2
GLOVE BIOGEL PI INDICATOR 7.5 (GLOVE) ×4
GOWN STRL REUS W/TWL LRG LVL3 (GOWN DISPOSABLE) ×12 IMPLANT
HEMOSTAT SURGICEL 2X3 (HEMOSTASIS) ×3 IMPLANT
NEEDLE HYPO 22GX1.5 SAFETY (NEEDLE) IMPLANT
NEEDLE MAYO CATGUT SZ4 (NEEDLE) IMPLANT
NS IRRIG 1000ML POUR BTL (IV SOLUTION) IMPLANT
PACK VAGINAL WOMENS (CUSTOM PROCEDURE TRAY) ×3 IMPLANT
PAD OB MATERNITY 4.3X12.25 (PERSONAL CARE ITEMS) ×3 IMPLANT
SET CYSTO W/LG BORE CLAMP LF (SET/KITS/TRAYS/PACK) ×3 IMPLANT
SUT VIC AB 0 CT1 18XCR BRD8 (SUTURE) ×4 IMPLANT
SUT VIC AB 0 CT1 27 (SUTURE)
SUT VIC AB 0 CT1 27XBRD ANBCTR (SUTURE) IMPLANT
SUT VIC AB 0 CT1 8-18 (SUTURE) ×12
SUT VIC AB 2-0 SH 27 (SUTURE) ×3
SUT VIC AB 2-0 SH 27XBRD (SUTURE) ×1 IMPLANT
SUT VICRYL 0 TIES 12 18 (SUTURE) ×3 IMPLANT
SYR TB 1ML 25GX5/8 (SYRINGE) ×3 IMPLANT
TOWEL OR 17X24 6PK STRL BLUE (TOWEL DISPOSABLE) ×6 IMPLANT
TRAY FOLEY CATH SILVER 14FR (SET/KITS/TRAYS/PACK) ×3 IMPLANT
WATER STERILE IRR 1000ML POUR (IV SOLUTION) ×3 IMPLANT

## 2015-08-24 NOTE — H&P (View-Only) (Signed)
Teresa Freeman is a 40 y.o. , P: 3-0-0-3 who presents for hysterectomy because of menorrhagia.  The patient underwent tubal sterilization in 2014 and shortly thereafter began to experience heavy, prolong menstrual periods. Her flow would last up to 14 days with pad change, on  most days, every  2 hours.  She reports cramping that she rated 6/10 on a 10 point pain scale but relief was achieved with Ibuprofen 600 mg.  Lysteda was prescribed for her  bleeding volume but with no relief.  Her hemoglobin dropped as low as 8.6  (most recent hemoglobin was 12.2)   but her TSH was normal.   A pelvic ultrasound in February 2017 showed a  multiple fibroid uterus with the # 3 largest:  submucosal vs intracavitary: 3.8 x 2.5 x 3.0 cm,  anterior intramural: 1.6 x 1.6 x 1.7 cm and anterior sub-serosal: 1.8 x 1.4 x 1.8 cm; with both ovaries appearing normal. In March 2017 the patient underwent hysteroscopic resection of the endometrial mass  seen on ultrasound and an endometrial ablation.  The pathology of the endometrial curettings  showed: benign smooth muscle, consistent with leiomyomas,; proliferative endometrium, benign endometrial polyp and no hyperplasia or malignancy.  Following that procedure the patient developed spotting for 14 days which spontaneously abated until May when she began to have a significant flow requiring a pad change every 1.5 hours.  Fortunately this bleeding responded to Provera 10 mg daily. The patient denies any changes in bowel or bladder function or dyspareunia.  She does admit to some post coital bleeding.  After reviewing both medical and surgical management options for treatment of her symptoms,  the patient has decided to proceed with definitive therapy in the form of hysterectomy.   Past Medical History  OB History: G: 3;   P: 3-0-0-3;  SVB 2004 and 2010 (largest infant 8 lbs. 8 oz.);  C-section: 2007   GYN History: menarche: 40 YO    LMP: see HPI (continuous bleeding)    Contracepton  bilateral tubal ligation  The patient denies history of sexually transmitted disease.  Remote  history of abnormal PAP smear-no treatment and normal since.   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   ROS: Denies corrective lenses,  headache, vision changes, nasal congestion, dysphagia, tinnitus, dizziness, hoarseness, cough,  chest pain, shortness of breath, nausea, vomiting, diarrhea,constipation,  urinary frequency, urgency  dysuria, hematuria, vaginitis symptoms, pelvic pain, swelling of joints,easy bruising,  myalgias, arthralgias, skin rashes, unexplained weight loss and except as is mentioned in the history of present illness, patient's review of systems is otherwise negative.    Physical Exam  Bp: 110/76   P: 76   R: 18   Temperature:  99.2 degrees F orally  Weight: 191 lbs.  Height: 5'6" BMI: 30.8  Neck: supple without masses or thyromegaly Lungs: clear  to auscultation Heart: regular rate and rhythm Abdomen: soft, non-tender and no organomegaly Pelvic:EGBUS- wnl; vagina-normal rugae; uterus-normal size, cervix without lesions or motion tenderness; adnexae-no tenderness or masses Extremities:  no clubbing, cyanosis or edema   Assesment:  Menorrhagia             Uterine  Fibroids             History of Anemia   Disposition:  A discussion was held with patient regarding the indication for her procedure(s) along with the risks, which include but are not limited to: reaction to anesthesia, damage to adjacent organs, infection and excessive bleeding.  The patient verbalized understanding of these risks and has consented to proceed with a Total Vaginal Hysterectomy, Bilateral Salpingectomy, Cystoscopy, Possible Laparoscopically Assisted Vaginal Hysterectomy and Possible Total Abdominal Hysterectomy at [place] on August 24, 2015.   CSN# [id number]   Howard Patton J. Florene Glen, PA-C  for Dr. Harvie Bridge. Mancel Bale

## 2015-08-24 NOTE — Anesthesia Postprocedure Evaluation (Signed)
Anesthesia Post Note  Patient: Teresa Freeman  Procedure(s) Performed: Procedure(s) (LRB): TOTAL VAGINAL HYSTERECTOMY Bilateral Salpingectomy (N/A) CYSTOSCOPY (N/A)  Patient location during evaluation: PACU Anesthesia Type: General Level of consciousness: awake and alert and oriented Pain management: pain level controlled Vital Signs Assessment: post-procedure vital signs reviewed and stable Respiratory status: spontaneous breathing, nonlabored ventilation, respiratory function stable and patient connected to nasal cannula oxygen Cardiovascular status: blood pressure returned to baseline and stable Postop Assessment: no signs of nausea or vomiting Anesthetic complications: no     Last Vitals:  Filed Vitals:   08/24/15 1215 08/24/15 1222  BP: 129/91   Pulse: 69 68  Temp:    Resp: 16 16    Last Pain:  Filed Vitals:   08/24/15 1223  PainSc: 6    Pain Goal: Patients Stated Pain Goal: 4 (08/24/15 0745)               [name] A.

## 2015-08-24 NOTE — Transfer of Care (Signed)
Immediate Anesthesia Transfer of Care Note  Patient: Teresa Freeman  Procedure(s) Performed: Procedure(s): TOTAL VAGINAL HYSTERECTOMY Bilateral Salpingectomy (N/A) CYSTOSCOPY (N/A)  Patient Location: PACU  Anesthesia Type:General  Level of Consciousness: awake, alert , oriented and patient cooperative  Airway & Oxygen Therapy: Patient Spontanous Breathing and Patient connected to nasal cannula oxygen  Post-op Assessment: Report given to RN and Post -op Vital signs reviewed and stable  Post vital signs: Reviewed and stable  Last Vitals:  Filed Vitals:   08/24/15 0745  BP: 126/81  Pulse: 66  Temp: 37 C  Resp: 20    Last Pain: There were no vitals filed for this visit.    Patients Stated Pain Goal: 4 ([id number])  Complications: No apparent anesthesia complications

## 2015-08-24 NOTE — Anesthesia Postprocedure Evaluation (Signed)
Anesthesia Post Note  Patient: Teresa Freeman  Procedure(s) Performed: Procedure(s) (LRB): TOTAL VAGINAL HYSTERECTOMY Bilateral Salpingectomy (N/A) CYSTOSCOPY (N/A)  Patient location during evaluation: [facility] Anesthesia Type: General Level of consciousness: awake and alert Pain management: pain level controlled Vital Signs Assessment: post-procedure vital signs reviewed and stable Respiratory status: spontaneous breathing Cardiovascular status: blood pressure returned to baseline and stable Postop Assessment: no signs of nausea or vomiting Anesthetic complications: no     Last Vitals:  Filed Vitals:   08/24/15 1613 08/24/15 1727  BP: 139/94 132/83  Pulse: 73 72  Temp:  36.9 C  Resp: 11 12    Last Pain:  Filed Vitals:   08/24/15 1911  PainSc: 3    Pain Goal: Patients Stated Pain Goal: 3 (08/24/15 1906)               Ailene Ards

## 2015-08-24 NOTE — Addendum Note (Signed)
Addendum  created 08/24/15 2152 by Genevie Ann, CRNA   Modules edited: Clinical Notes   Clinical Notes:  File: [id number]

## 2015-08-24 NOTE — Interval H&P Note (Signed)
History and Physical Interval Note:  08/24/2015 8:47 AM  Teresa Freeman  has presented today for surgery, with the diagnosis of Menorrhagia  The various methods of treatment have been discussed with the patient and family. After consideration of risks, benefits and other options for treatment, the patient has consented to  Procedure(s): TOTAL VAGINAL HYSTERECTOMY Bilateral Salpingectomy (N/A) POSSIBLE LAVH, POSSIBLE TAH CYSTOSCOPY (N/A) as a surgical intervention .  The patient's history has been reviewed, patient examined, no change in status, stable for surgery.  I have reviewed the patient's chart and labs.  Questions were answered to the patient's satisfaction.     Delice Lesch

## 2015-08-24 NOTE — Anesthesia Preprocedure Evaluation (Addendum)
Anesthesia Evaluation  Patient identified by MRN, date of birth, ID band Patient awake    Reviewed: Allergy & Precautions, NPO status , Patient's Chart, lab work & pertinent test results  Airway Mallampati: II  TM Distance: >3 FB Neck ROM: Full    Dental  (+) Teeth Intact, Dental Advisory Given   Pulmonary    breath sounds clear to auscultation       Cardiovascular hypertension,  Rhythm:Regular Rate:Normal     Neuro/Psych    GI/Hepatic   Endo/Other    Renal/GU      Musculoskeletal   Abdominal   Peds  Hematology   Anesthesia Other Findings   Reproductive/Obstetrics                            Anesthesia Physical Anesthesia Plan  ASA: II  Anesthesia Plan: General   Post-op Pain Management:    Induction: Intravenous  Airway Management Planned: Oral ETT  Additional Equipment:   Intra-op Plan:   Post-operative Plan: Extubation in OR  Informed Consent: I have reviewed the patients History and Physical, chart, labs and discussed the procedure including the risks, benefits and alternatives for the proposed anesthesia with the patient or authorized representative who has indicated his/her understanding and acceptance.   Dental advisory given  Plan Discussed with: Anesthesiologist and CRNA  Anesthesia Plan Comments:         Anesthesia Quick Evaluation

## 2015-08-24 NOTE — Op Note (Addendum)
Preop Diagnosis: 1.Menorrhagia 2.h/o anemia 3.Symptomatic Fibroids  Postop Diagnosis: 1.Menorrhagia 2.h/o anemia 3.Symptomatic Fibroids   Procedure: 1.TOTAL VAGINAL HYSTERECTOMY 2.LEFT OVARIAN CYSTECTOMY 3.CYSTOSCOPY   Anesthesia: General   Attending: Everett Graff, MD   Assistant: Earnstine Regal, PA-C  Findings: 1.Left Ovarian Simple Cyst 2.Fibroid Uterus  Pathology: 1.Uterus and Cervix with Fibroids 2.Left Ovarian Cyst Wall  Fluids: 1500 cc  UOP: 1000 cc   EBL: 123XX123 cc  Complications: None  Procedure:The patient was taken to the operating room after the risks, benefits and alternatives were discussed with the patient. The patient verbalized understanding and consent signed and witnessed. The patient was placed under general anesthesia per the anesthesiologist and a timeout was performed per protocol. The patient was prepped and draped in the normal sterile fashion in the dorsal lithotomy position.  A weighted speculum was placed the patient's vagina and the anterior lip of the cervix was grasped with a single-tooth tenaculum and Dever retractors were placed for vaginal wall retraction. The cervix was circumscribed with the bovie after injecting the cervix with pitressin at a concentration of 20 units of Pitressin in 50 cc of normal saline.  Once the cervix was circumscribed the anterior cul-de-sac was entered without difficulty.  The posterior cul-de-sac was entered without difficulty as well.  Curved Heaney clamps were used to clamp the uterosacral and cardinal ligaments and the tissue was then cut and suture ligated using 0 Vicryl. This was done bilaterally and sequentially. The remaining parametrial tissue was clamped, cut and suture ligated using 0 Vicryl in a sequential and bilateral fashion as well.  The uterine fundus was exteriorized and the remaining pedicles were bilaterally clamped, cut and suture ligated with 0 vicryl.  The uterus and cervix were handed off to be sent to  pathology. The right ovary was within normal limits and the left ovary had a simple ovarian cyst. The cyst was cauterized and dissected out and cyst wall sent to pathology.  The angles of the cuff were sutured using 0 vicryl.  The cuff was then repaired to the midline with figure-of-eight and interrupted stitches of 0 Vicryl.  The cuff was noted to be hemostatic.  Cystoscopy was performed and bilateral ureters were noted to efflux without difficulty.  Sponge, lap and needle count were correct.  The patient tolerated the procedure well and was returned to the recovery room in good condition.

## 2015-08-24 NOTE — Anesthesia Procedure Notes (Signed)
Procedure Name: Intubation Date/Time: 08/24/2015 9:04 AM Performed by: Raenette Rover Pre-anesthesia Checklist: Patient identified, Emergency Drugs available, Suction available and Patient being monitored Patient Re-evaluated:Patient Re-evaluated prior to inductionOxygen Delivery Method: Circle system utilized Preoxygenation: Pre-oxygenation with 100% oxygen Intubation Type: IV induction Ventilation: Mask ventilation without difficulty Laryngoscope Size: Mac and 3 Grade View: Grade I Tube type: Oral Tube size: 7.0 mm Number of attempts: 1 Airway Equipment and Method: Stylet Placement Confirmation: ETT inserted through vocal cords under direct vision,  positive ETCO2,  CO2 detector and breath sounds checked- equal and bilateral Secured at: 20 cm Tube secured with: Tape Dental Injury: Teeth and Oropharynx as per pre-operative assessment

## 2015-08-24 NOTE — Progress Notes (Signed)
Day of Surgery Procedure(s) (LRB): TOTAL VAGINAL HYSTERECTOMY (N/A) CYSTOSCOPY (N/A)  Subjective: Patient reports no N/V.  Tolerating po.  Objective: I have reviewed patient's vital signs and intake and output.  UOP 750cc/4 1/2 hrs  General: alert and no distress Resp: clear to auscultation bilaterally Cardio: regular rate and rhythm GI: soft, app tender, ND, decreased BS Vaginal Bleeding: none  Assessment: s/p Procedure(s): TOTAL VAGINAL HYSTERECTOMY Bilateral Salpingectomy (N/A) CYSTOSCOPY (N/A): stable  Plan: Advance diet as tolerated  Encourage ambulation Encourage IS CBC in am       Brantley Wiley Y 08/24/2015, 6:58 PM

## 2015-08-25 ENCOUNTER — Encounter (HOSPITAL_COMMUNITY): Payer: Self-pay | Admitting: Obstetrics and Gynecology

## 2015-08-25 DIAGNOSIS — N920 Excessive and frequent menstruation with regular cycle: Secondary | ICD-10-CM | POA: Diagnosis not present

## 2015-08-25 LAB — CBC
HCT: 31.6 % — ABNORMAL LOW (ref 36.0–46.0)
HEMOGLOBIN: 10.7 g/dL — AB (ref 12.0–15.0)
MCH: 25.4 pg — ABNORMAL LOW (ref 26.0–34.0)
MCHC: 33.9 g/dL (ref 30.0–36.0)
MCV: 74.9 fL — ABNORMAL LOW (ref 78.0–100.0)
Platelets: 218 10*3/uL (ref 150–400)
RBC: 4.22 MIL/uL (ref 3.87–5.11)
RDW: 14.9 % (ref 11.5–15.5)
WBC: 12.2 10*3/uL — AB (ref 4.0–10.5)

## 2015-08-25 MED ORDER — METOCLOPRAMIDE HCL 5 MG/ML IJ SOLN
10.0000 mg | Freq: Four times a day (QID) | INTRAMUSCULAR | Status: DC
  Administered 2015-08-25: 10 mg via INTRAVENOUS
  Filled 2015-08-25: qty 2

## 2015-08-25 MED ORDER — IBUPROFEN 600 MG PO TABS: ORAL_TABLET | ORAL | Status: DC

## 2015-08-25 MED ORDER — OXYCODONE-ACETAMINOPHEN 5-325 MG PO TABS: 1.0000 | ORAL_TABLET | ORAL | Status: DC | PRN

## 2015-08-25 NOTE — Discharge Summary (Signed)
Physician Discharge Summary  Patient ID: Teresa Freeman MRN: [id number] DOB/AGE: 1975/06/23 40 y.o.  Admit date: 08/24/2015 Discharge date: 08/25/2015   Discharge Diagnoses: Symptomatic Uterine Fibroids, Menorrhagia and Anemia  Active Problems:   Menorrhagia Symptomatic Fibroids  Operation: Total Vaginal Hysterectomy, Left Cystectomy with Cystoscopy  Discharged Condition: Good  Hospital Course: On the date of admission the patient underwent the aforementioned procedures and tolerated them well.  Post operative course was unremarkable with the  patient resuming bowel and  bladder function by post operative day #1 and was therefore deemed ready for discharge home.  Discharge hemoglobin/hematocrit was 10.7/31.6.  Disposition: 01-Home or Self Care  Discharge Medications:    Medication List    STOP taking these medications        norethindrone-ethinyl estradiol 1-20 MG-MCG tablet  Commonly known as:  JUNEL FE 1/20      TAKE these medications        ferrous sulfate 325 (65 FE) MG tablet  Take 325 mg by mouth daily with breakfast.     hydrochlorothiazide 12.5 MG capsule  Commonly known as:  MICROZIDE  Take 12.5 mg by mouth daily.     ibuprofen 600 MG tablet  Commonly known as:  ADVIL,MOTRIN  1  po  pc every 6 hours for 5 days then prn-pain     multivitamin with minerals tablet  Take 1 tablet by mouth daily.     oxyCODONE-acetaminophen 5-325 MG tablet  Commonly known as:  PERCOCET/ROXICET  Take 1-2 tablets by mouth every 4 (four) hours as needed for severe pain (moderate to severe pain (when tolerating fluids)).          Follow-up: Dr. Harvie Bridge. Mancel Bale on October 05, 2015 at 3:45 p.m.   SignedEarnstine Regal, PA-C 08/25/2015, 7:58 AM

## 2015-08-25 NOTE — Progress Notes (Signed)
Discharge teaching complete. Pt understood all information and did not have any questions. Pt ambulated out of the hospital and discharged home to family.  

## 2015-08-25 NOTE — Progress Notes (Signed)
Teresa Freeman is a37 y.o.  [id number] Post Op Date # 1:  TVH/Cystoscopy  Subjective: Patient is Doing well postoperatively. Patient has Pain is controlled with current analgesics. Medications being used: prescription NSAID's including Ketorolac 30 mg and narcotic analgesics including PCA Dilaudid (reduced dose). The patient has ambulated in the halls without dizziness, has voided and is tolerating liquids.  Has not passed flatus.    Objective: Vital signs in last 24 hours: Temp:  [97.5 F (36.4 C)-99.2 F (37.3 C)] 99.2 F (37.3 C) (06/13 0524) Pulse Rate:  [66-86] 68 (06/13 0524) Resp:  [11-20] 15 (06/13 0541) BP: (124-143)/(50-94) 124/81 mmHg (06/13 0524) SpO2:  [96 %-100 %] 100 % (06/13 0541) FiO2 (%):  [69 %-77 %] 69 % (06/13 0541) Weight:  [191 lb (86.637 kg)] 191 lb (86.637 kg) (06/12 1338)  Intake/Output from previous day: 06/12 0701 - 06/13 0700 In: 2740 [P.O.:940; I.V.:1800] Out: 5150 [Urine:5050] Intake/Output this shift:    Recent Labs Lab 08/20/15 0926 08/25/15 0519  WBC 6.2 12.2*  HGB 12.2 10.7*  HCT 35.9* 31.6*  PLT 254 218     Recent Labs Lab 08/20/15 0926  NA 136  K 3.8  CL 103  CO2 27  BUN 14  CREATININE 0.80  CALCIUM 9.1  GLUCOSE 86    EXAM: General: alert, cooperative and no distress Resp: clear to auscultation bilaterally Cardio: regular rate and rhythm, S1, S2 normal, no murmur, click, rub or gallop GI: Decreased bowel sounds Extremities: Homans sign is negative, no sign of DVT and no calf tenderness. Vaginal Bleeding: minimal and quarter-sized blood stain on perineal pad   Assessment: s/p Procedure(s): TOTAL VAGINAL HYSTERECTOMY LEFT CYSTECTOMY CYSTOSCOPY: stable, progressing well and anemia  Plan: Advance diet Advance to PO medication Routine care but will consider discharge later today. (Diet will be advanced as tolerated) POWELL,ELMIRA, PA-C 08/25/2015 7:43 AM   Pt still with slightly decreased BS but tolerating po  without N/V and +flatus.  Precautions discussed with pt.

## 2015-08-25 NOTE — Discharge Instructions (Signed)
Call [facility] @ [phone] if:  You have a temperature greater than or equal to 100.4 degrees Farenheit orally You have pain that is not made better by the pain medication given and taken as directed You have excessive bleeding or problems urinating  Take Colace (Docusate Sodium/Stool Softener) 100 mg 2-3 times daily while taking narcotic pain medicine to avoid constipation or until bowel movements are regular. Take Ibuprofen 600 mg with food every 6 hours for 5 days then as needed for pain Take your iron supplement twice a day for the next 12 weeks  You may drive after 2  weeks You may walk up steps  You may shower  You may resume a regular diet  Do not lift over 15 pounds for 6 weeks Avoid anything in vagina for 6 weeks (or until after your post-operative visit)

## 2015-11-25 IMAGING — CT CT ABD-PELV W/ CM
1 of 2 series · 15 of 32 positions shown, 19 images · IV contrast (OMNIPAQUE 300)
Comparison: None.

CLINICAL DATA: Generalized abdominal pain, diarrhea, symptoms
beginning last night.

EXAM:
CT ABDOMEN AND PELVIS WITH CONTRAST
TECHNIQUE: Multidetector CT imaging of the abdomen and pelvis was performed
using the standard protocol following bolus administration of
intravenous contrast.
CONTRAST:  100mL OMNIPAQUE IOHEXOL 300 MG/ML  SOLN

[Series 2: abd/pel with · axial · 0.74mm/px · z∈[-488,-102]mm · 15 of 85 slices shown, 19 images]
[im 4/85  soft-tissue]
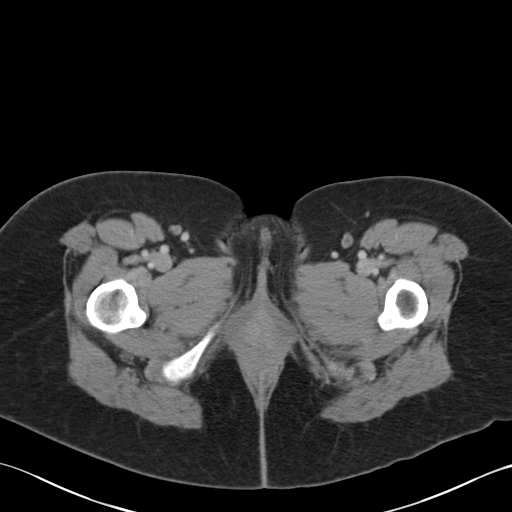
[im 4/85  bone]
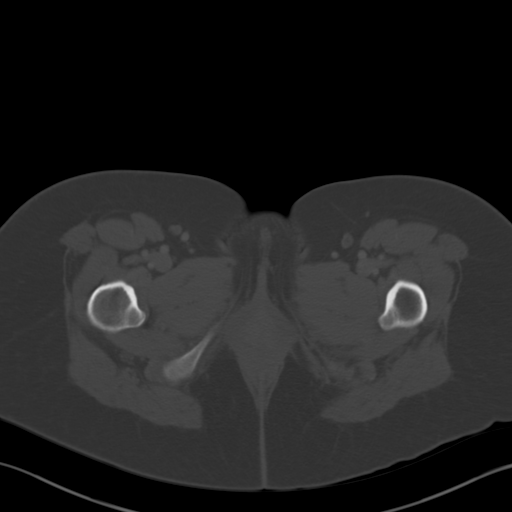
[im 11/85  soft-tissue]
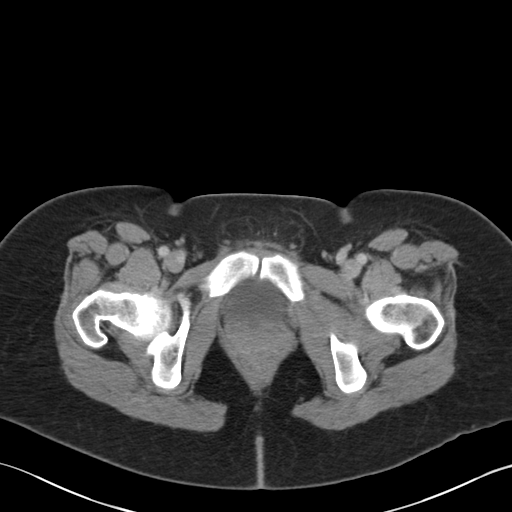
[im 17/85  soft-tissue]
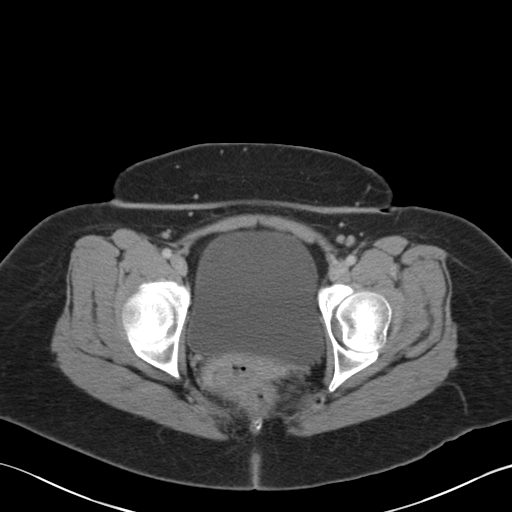
[im 24/85  soft-tissue]
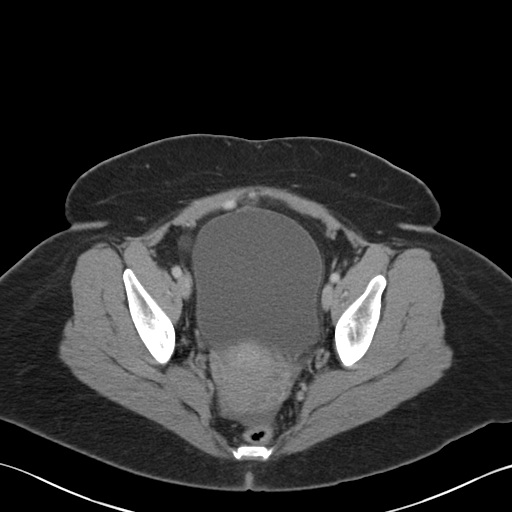
[im 31/85  soft-tissue]
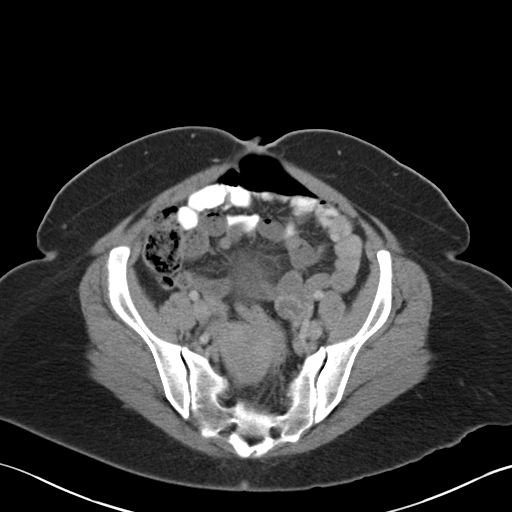
[im 37/85  soft-tissue]
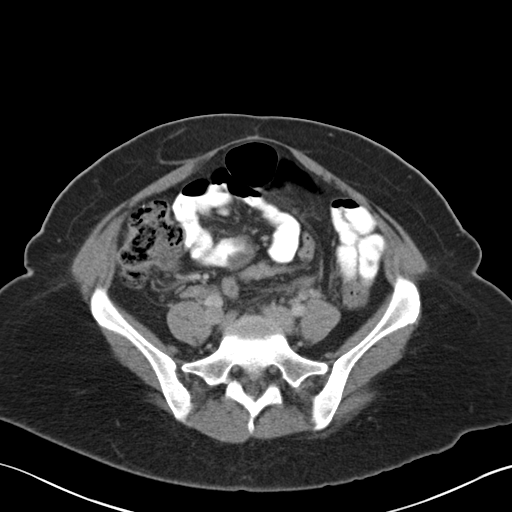
[im 44/85  soft-tissue]
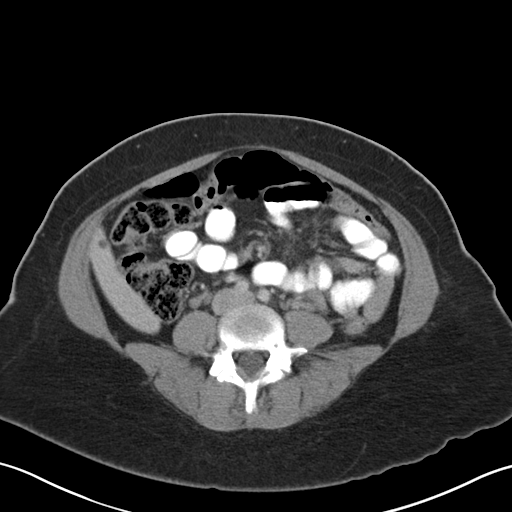
[im 48/85  soft-tissue]
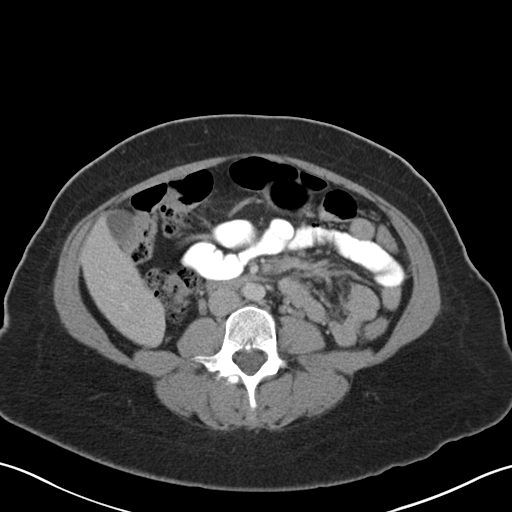
[im 54/85  soft-tissue]
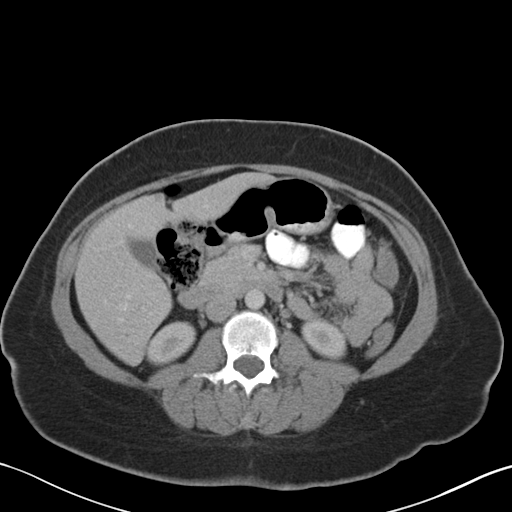
[im 54/85  bone]
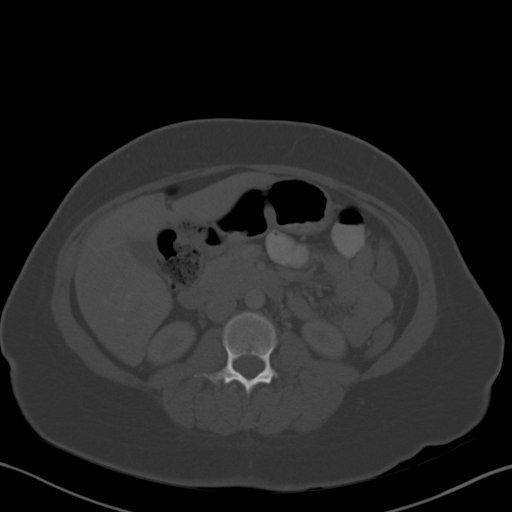
[im 61/85  soft-tissue]
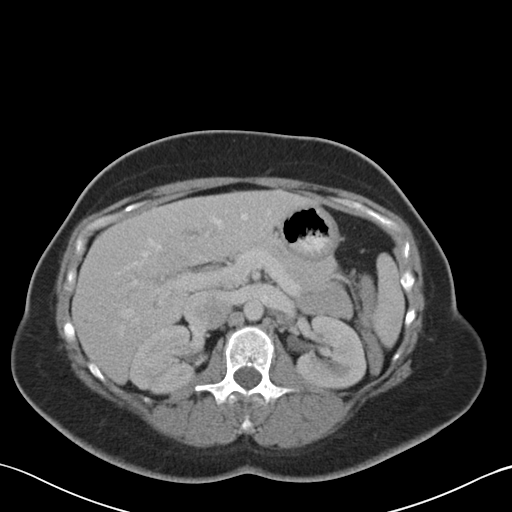
[im 68/85  soft-tissue]
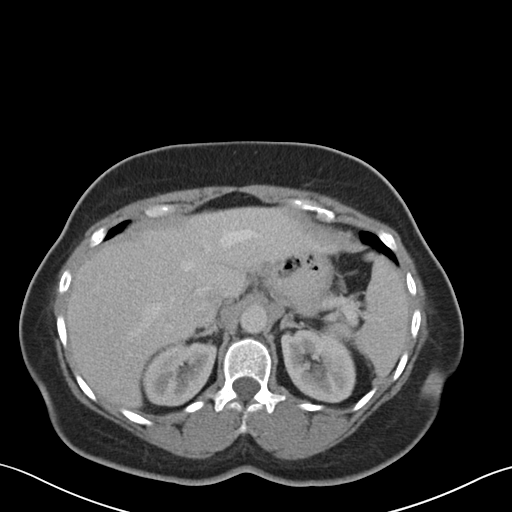
[im 71/85  lung]
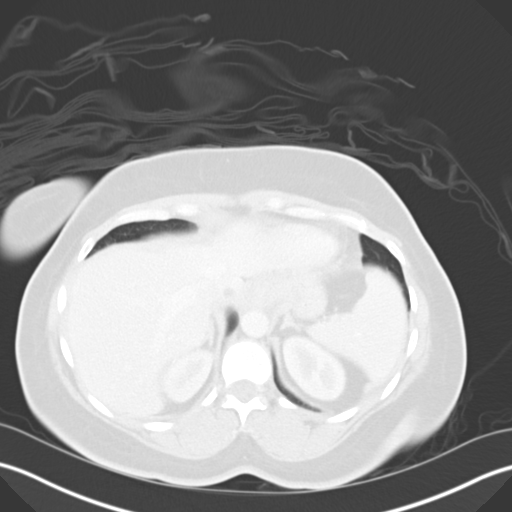
[im 74/85  soft-tissue]
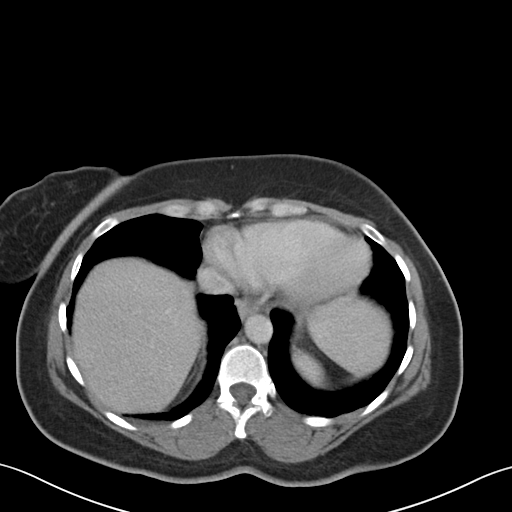
[im 74/85  lung]
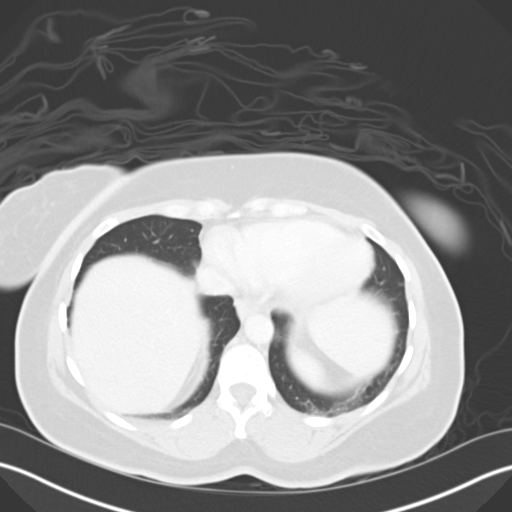
[im 78/85  lung]
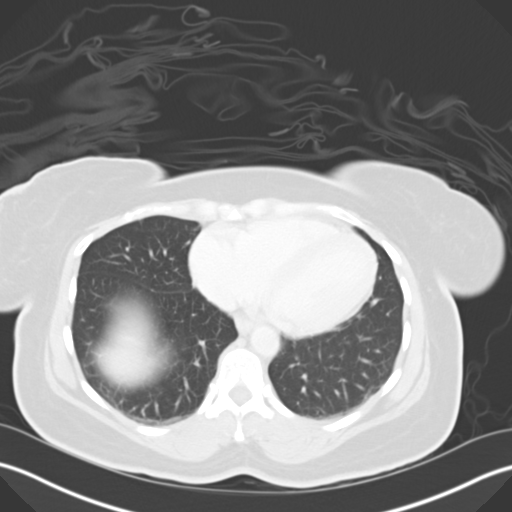
[im 81/85  soft-tissue]
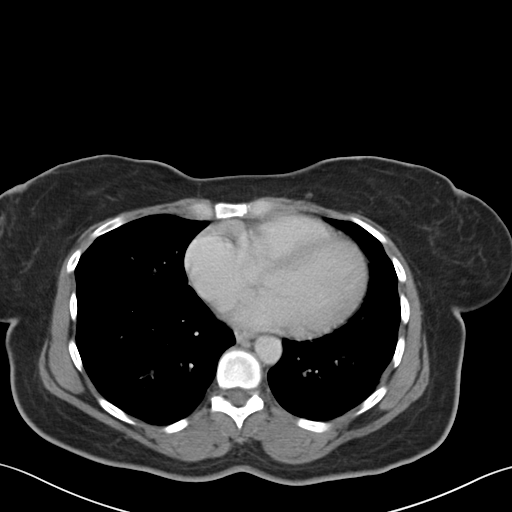
[im 81/85  lung]
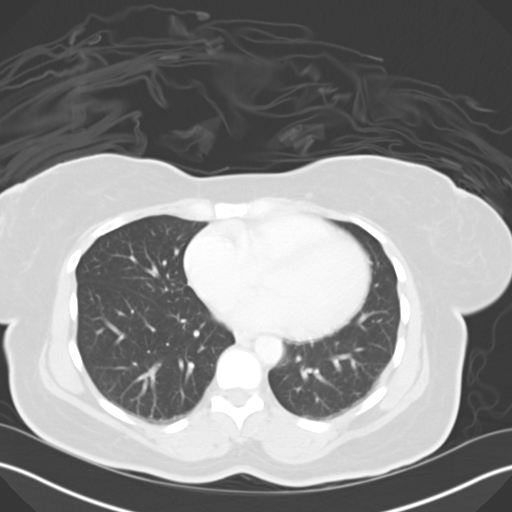

[15 of 32 positions shown; findings below may reference images not displayed]

FINDINGS: LUNG BASES: Included view of the lung bases are clear. Visualized
heart size is normal. Trace pericardial thickening.

SOLID ORGANS: The spleen, gallbladder, pancreas and adrenal glands
are unremarkable. Subcentimeter hypodensities in the liver likely
reflect cysts. The liver is otherwise unremarkable.

GASTROINTESTINAL TRACT: The stomach, small and large bowel are
normal in course and caliber without inflammatory changes. Enteric
contrast has not yet reached the distal small bowel. Normal
appendix.

KIDNEYS/ URINARY TRACT: Kidneys are orthotopic, demonstrating
symmetric enhancement. No nephrolithiasis, hydronephrosis or solid
renal masses. The unopacified ureters are normal in course and
caliber. Urinary bladder is partially distended and unremarkable.

PERITONEUM/RETROPERITONEUM: Small amount of free fluid in the pelvis
may be physiologic. Punctate calcification RIGHT abdomen most
consistent with phleboliths. No intraperitoneal free air. Aortoiliac
vessels are normal in course and caliber. No lymphadenopathy by CT
size criteria. Internal reproductive organs are normal; involuting
LEFT corpus luteal cyst.

SOFT TISSUE/OSSEOUS STRUCTURES: Nonsuspicious. Diastases of the
rectus abdominis muscles.
IMPRESSION: Involuting LEFT corpus luteal cyst.

No bowel obstruction.  Normal appendix.

  By: Gentaro Matama

## 2016-06-08 LAB — HM PAP SMEAR: HM Pap smear: NORMAL

## 2018-03-09 ENCOUNTER — Encounter: Payer: Self-pay | Admitting: Nurse Practitioner

## 2018-04-30 ENCOUNTER — Ambulatory Visit: Payer: BC Managed Care – PPO | Admitting: Nurse Practitioner

## 2018-08-31 ENCOUNTER — Ambulatory Visit: Payer: BC Managed Care – PPO | Admitting: Nurse Practitioner

## 2018-09-03 ENCOUNTER — Other Ambulatory Visit: Payer: Self-pay | Admitting: *Deleted

## 2018-09-03 DIAGNOSIS — Z20822 Contact with and (suspected) exposure to covid-19: Secondary | ICD-10-CM

## 2018-09-06 NOTE — Addendum Note (Signed)
Addended by: Brigitte Pulse on: 09/06/2018 04:00 PM   Modules accepted: Orders

## 2018-10-01 ENCOUNTER — Ambulatory Visit: Payer: Self-pay | Admitting: *Deleted

## 2018-10-01 NOTE — Telephone Encounter (Signed)
Pt called in c/o having blood in her urine yesterday twice and lower back pain on both sides for a week.   No blood in urine today. She is in the process of being established with [facility] practice because she recently moved.  I have recommended she go on to an urgent care.   She was agreeable to this plan.    She is in [facility] [place], [place]   so will go to an urgent care there.    Reason for Disposition . Side (flank) or back pain present  Answer Assessment - Initial Assessment Questions 1. COLOR of URINE: "Describe the color of the urine."  (e.g., tea-colored, pink, red, blood clots, bloody)     My urine is darker than normal.   Yesterday morning the toilet water was red. 2. ONSET: "When did the bleeding start?"      Yesterday morning.   Again later yesterday.   No blood this morning either  I'm having lower back pain on both sides.     3. EPISODES: "How many times has there been blood in the urine?" or "How many times today?"     2 times yesterday.   None today 4. PAIN with URINATION: "Is there any pain with passing your urine?" If so, ask: "How bad is the pain?"  (Scale 1-10; or mild, moderate, severe)    - MILD - complains slightly about urination hurting    - MODERATE - interferes with normal activities      - SEVERE - excruciating, unwilling or unable to urinate because of the pain      No 5. FEVER: "Do you have a fever?" If so, ask: "What is your temperature, how was it measured, and when did it start?"     No 6. ASSOCIATED SYMPTOMS: "Are you passing urine more frequently than usual?"     No 7. OTHER SYMPTOMS: "Do you have any other symptoms?" (e.g., back/flank pain, abdominal pain, vomiting)     Lower back on both sides for a week.   I just moved and thought I hurt it moving things. 8. PREGNANCY: "Is there any chance you are pregnant?" "When was your last menstrual period?"     Had hysterectomy  Protocols used: URINE - BLOOD IN-A-AH

## 2018-10-19 ENCOUNTER — Other Ambulatory Visit: Payer: Self-pay

## 2018-10-19 ENCOUNTER — Telehealth (INDEPENDENT_AMBULATORY_CARE_PROVIDER_SITE_OTHER): Payer: BC Managed Care – PPO | Admitting: Internal Medicine

## 2018-10-19 DIAGNOSIS — M7021 Olecranon bursitis, right elbow: Secondary | ICD-10-CM | POA: Diagnosis not present

## 2018-10-19 DIAGNOSIS — R319 Hematuria, unspecified: Secondary | ICD-10-CM

## 2018-10-19 NOTE — Progress Notes (Signed)
Virtual Visit via Video Note  I connected with Teresa Freeman on 10/19/18 at  9:30 AM EDT by a video enabled telemedicine application and verified that I am speaking with the correct person using two identifiers.  Location patient: home Location provider: work office Persons participating in the virtual visit: patient, provider  I discussed the limitations of evaluation and management by telemedicine and the availability of in person appointments. The patient expressed understanding and agreed to proceed.   HPI: This is a scheduled visit to establish care and discuss some acute issues.  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  She has been doing this for 17 years.  She is married, has 3 children.  She is a never smoker, does not drink alcohol.  Has no family history of significance.  She has 2 acute issues today:  1.  Right elbow has been swollen and painful.  This started about 2 months ago.  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  She has not had any fever.  2.  She noticed some blood in her urine, went to urgent care and was told she did not have a urine infection.  She thinks it is due to her recent move and heavy lifting.   ROS: Constitutional: Denies fever, chills, diaphoresis, appetite change and fatigue.  HEENT: Denies photophobia, eye pain, redness, hearing loss, ear pain, congestion, sore throat, rhinorrhea, sneezing, mouth sores, trouble swallowing, neck pain, neck stiffness and tinnitus.   Respiratory: Denies SOB, DOE, cough, chest tightness,  and wheezing.   Cardiovascular: Denies chest pain, palpitations and leg swelling.  Gastrointestinal: Denies nausea, vomiting, abdominal pain, diarrhea, constipation, blood in stool and abdominal distention.  Genitourinary: Denies dysuria, urgency, frequency, hematuria, flank pain and difficulty  urinating.  Endocrine: Denies: hot or cold intolerance, sweats, changes in hair or nails, polyuria, polydipsia. Musculoskeletal: Denies myalgias, back pain, joint swelling, arthralgias and gait problem.  Skin: Denies pallor, rash and wound.  Neurological: Denies dizziness, seizures, syncope, weakness, light-headedness, numbness and headaches.  Hematological: Denies adenopathy. Easy bruising, personal or family bleeding history  Psychiatric/Behavioral: Denies suicidal ideation, mood changes, confusion, nervousness, sleep disturbance and agitation   Past Medical History:  Diagnosis Date  . Anemia   . Ganglion cyst of wrist 05/2012   right  . Hypertension     Past Surgical History:  Procedure Laterality Date  . CESAREAN SECTION  11/25/2005  . CYSTOSCOPY N/A 08/24/2015   Procedure: CYSTOSCOPY;  Surgeon: Everett Graff, MD;  Location: [facility];  Service: Gynecology;  Laterality: N/A;  . GANGLION CYST EXCISION Right 06/04/2012   Procedure: RIGHT DORSAL GANGLION EXCISION OF WRIST;  Surgeon: Jolyn Nap, MD;  Location: [facility];  Service: Orthopedics;  Laterality: Right;  . LAPAROSCOPIC BILATERAL SALPINGECTOMY Bilateral 01/11/2013   Procedure: LAPAROSCOPIC BILATERAL SALPINGECTOMY;  Surgeon: Delice Lesch, MD;  Location: [facility];  Service: Gynecology;  Laterality: Bilateral;  . VAGINAL HYSTERECTOMY N/A 08/24/2015   Procedure: TOTAL VAGINAL HYSTERECTOMY Bilateral Salpingectomy;  Surgeon: Everett Graff, MD;  Location: [facility];  Service: Gynecology;  Laterality: N/A;  . WISDOM TOOTH EXTRACTION      No family history on file.  SOCIAL HX:   reports that she has never smoked. She has never used smokeless tobacco. She reports that she does not drink alcohol or use drugs.   Current  Outpatient Medications:  Marland Kitchen  Multiple Vitamins-Minerals (MULTIVITAMIN WITH MINERALS) tablet, Take 1 tablet by mouth daily., Disp: , Rfl:   EXAM:   VITALS per patient if applicable: None reported   GENERAL: alert, oriented, appears well and in no acute distress  HEENT: atraumatic, conjunttiva clear, no obvious abnormalities on inspection of external nose and ears  NECK: normal movements of the head and neck  LUNGS: on inspection no signs of respiratory distress, breathing rate appears normal, no obvious gross increased work of breathing, gasping or wheezing  CV: no obvious cyanosis  MS: moves all visible extremities without noticeable abnormality  PSYCH/NEURO: pleasant and cooperative, no obvious depression or anxiety, speech and thought processing grossly intact  ASSESSMENT AND PLAN:   Olecranon bursitis of right elbow -Suspect this represents bursitis, especially given increased time spent on a computer during the spring and summer. -Have advised icing, ibuprofen, padded elbow brace which she will obtain on [employer].  Hematuria, unspecified type -Per patient has cleared up, could certainly have been myoglobinuria due to heavy lifting. -When she comes in the office will check another UA, and if persistent hematuria will need to consider UTI versus possibly kidney stones.     I discussed the assessment and treatment plan with the patient. The patient was provided an opportunity to ask questions and all were answered. The patient agreed with the plan and demonstrated an understanding of the instructions.   The patient was advised to call back or seek an in-person evaluation if the symptoms worsen or if the condition fails to improve as anticipated.    Lelon Frohlich, MD  [facility]

## 2018-11-14 ENCOUNTER — Encounter: Payer: Self-pay | Admitting: Internal Medicine

## 2018-11-15 ENCOUNTER — Encounter: Payer: Self-pay | Admitting: Internal Medicine

## 2018-11-15 ENCOUNTER — Ambulatory Visit (INDEPENDENT_AMBULATORY_CARE_PROVIDER_SITE_OTHER): Payer: BC Managed Care – PPO | Admitting: Internal Medicine

## 2018-11-15 ENCOUNTER — Other Ambulatory Visit: Payer: Self-pay

## 2018-11-15 VITALS — BP 130/96 | HR 70 | Temp 97.6°F | Ht 66.0 in | Wt 209.4 lb

## 2018-11-15 DIAGNOSIS — Z114 Encounter for screening for human immunodeficiency virus [HIV]: Secondary | ICD-10-CM | POA: Diagnosis not present

## 2018-11-15 DIAGNOSIS — D171 Benign lipomatous neoplasm of skin and subcutaneous tissue of trunk: Secondary | ICD-10-CM

## 2018-11-15 DIAGNOSIS — R030 Elevated blood-pressure reading, without diagnosis of hypertension: Secondary | ICD-10-CM

## 2018-11-15 DIAGNOSIS — E669 Obesity, unspecified: Secondary | ICD-10-CM | POA: Diagnosis not present

## 2018-11-15 DIAGNOSIS — Z0000 Encounter for general adult medical examination without abnormal findings: Secondary | ICD-10-CM | POA: Diagnosis not present

## 2018-11-15 DIAGNOSIS — Z23 Encounter for immunization: Secondary | ICD-10-CM

## 2018-11-15 DIAGNOSIS — R319 Hematuria, unspecified: Secondary | ICD-10-CM

## 2018-11-15 LAB — CBC WITH DIFFERENTIAL/PLATELET
Basophils Absolute: 0 10*3/uL (ref 0.0–0.1)
Basophils Relative: 0.7 % (ref 0.0–3.0)
Eosinophils Absolute: 0.2 10*3/uL (ref 0.0–0.7)
Eosinophils Relative: 4.7 % (ref 0.0–5.0)
HCT: 39.9 % (ref 36.0–46.0)
Hemoglobin: 13.1 g/dL (ref 12.0–15.0)
Lymphocytes Relative: 22.1 % (ref 12.0–46.0)
Lymphs Abs: 1.1 10*3/uL (ref 0.7–4.0)
MCHC: 32.9 g/dL (ref 30.0–36.0)
MCV: 79.6 fl (ref 78.0–100.0)
Monocytes Absolute: 0.3 10*3/uL (ref 0.1–1.0)
Monocytes Relative: 6.3 % (ref 3.0–12.0)
Neutro Abs: 3.3 10*3/uL (ref 1.4–7.7)
Neutrophils Relative %: 66.2 % (ref 43.0–77.0)
Platelets: 270 10*3/uL (ref 150.0–400.0)
RBC: 5.01 Mil/uL (ref 3.87–5.11)
RDW: 14 % (ref 11.5–15.5)
WBC: 5 10*3/uL (ref 4.0–10.5)

## 2018-11-15 LAB — COMPREHENSIVE METABOLIC PANEL
ALT: 13 U/L (ref 0–35)
AST: 14 U/L (ref 0–37)
Albumin: 4.1 g/dL (ref 3.5–5.2)
Alkaline Phosphatase: 62 U/L (ref 39–117)
BUN: 15 mg/dL (ref 6–23)
CO2: 29 mEq/L (ref 19–32)
Calcium: 9.1 mg/dL (ref 8.4–10.5)
Chloride: 103 mEq/L (ref 96–112)
Creatinine, Ser: 0.78 mg/dL (ref 0.40–1.20)
GFR: 98.63 mL/min (ref >60.00)
Glucose, Bld: 91 mg/dL (ref 70–99)
Potassium: 4.2 mEq/L (ref 3.5–5.1)
Sodium: 139 mEq/L (ref 135–145)
Total Bilirubin: 0.3 mg/dL (ref 0.2–1.2)
Total Protein: 7.6 g/dL (ref 6.0–8.3)

## 2018-11-15 LAB — URINALYSIS, ROUTINE W REFLEX MICROSCOPIC
Bilirubin Urine: NEGATIVE
Hgb urine dipstick: NEGATIVE
Ketones, ur: NEGATIVE
Leukocytes,Ua: NEGATIVE
Nitrite: NEGATIVE
RBC / HPF: NONE SEEN (ref 0–?)
Specific Gravity, Urine: 1.025 (ref 1.000–1.030)
Total Protein, Urine: NEGATIVE
Urine Glucose: NEGATIVE
Urobilinogen, UA: 0.2 (ref 0.0–1.0)
pH: 6 (ref 5.0–8.0)

## 2018-11-15 LAB — LIPID PANEL
Cholesterol: 139 mg/dL (ref 0–200)
HDL: 54.3 mg/dL (ref >39.00)
LDL Cholesterol: 68 mg/dL (ref 0–99)
NonHDL: 84.3
Total CHOL/HDL Ratio: 3
Triglycerides: 82 mg/dL (ref 0.0–149.0)
VLDL: 16.4 mg/dL (ref 0.0–40.0)

## 2018-11-15 LAB — TSH: TSH: 1.31 u[IU]/mL (ref 0.35–4.50)

## 2018-11-15 LAB — VITAMIN D 25 HYDROXY (VIT D DEFICIENCY, FRACTURES): VITD: 38.11 ng/mL (ref 30.00–100.00)

## 2018-11-15 LAB — HEMOGLOBIN A1C: Hgb A1c MFr Bld: 5.7 % (ref 4.6–6.5)

## 2018-11-15 LAB — VITAMIN B12: Vitamin B-12: 423 pg/mL (ref 211–911)

## 2018-11-15 NOTE — Addendum Note (Signed)
Addended by: Westley Hummer B on: 11/15/2018 05:05 PM   Modules accepted: Orders

## 2018-11-15 NOTE — Progress Notes (Signed)
Established Patient Office Visit     CC/Reason for Visit: Annual preventive exam  HPI: Teresa Freeman is a 43 y.o. female who is coming in today for the above mentioned reasons.  She has no past medical history of significance.  When we last spoke she had a right olecranon bursitis and this has resolved with a padded elbow brace, icing and resting.  She had also visited the emergency department with hematuria which she thought was due to recent moving and heavy lifting.  She has routine dental care, does not have eye care, she is due for tetanus and flu vaccinations.  She is due for screening mammogram which she will schedule at the breast center.  She wants me to assess a lump in her right upper back.  She does not smoke.  Past Medical/Surgical History: Past Medical History:  Diagnosis Date  . Anemia   . Ganglion cyst of wrist 05/2012   right  . Hypertension     Past Surgical History:  Procedure Laterality Date  . CESAREAN SECTION  11/25/2005  . CYSTOSCOPY N/A 08/24/2015   Procedure: CYSTOSCOPY;  Surgeon: Everett Graff, MD;  Location: [facility];  Service: Gynecology;  Laterality: N/A;  . GANGLION CYST EXCISION Right 06/04/2012   Procedure: RIGHT DORSAL GANGLION EXCISION OF WRIST;  Surgeon: Jolyn Nap, MD;  Location: [place];  Service: Orthopedics;  Laterality: Right;  . LAPAROSCOPIC BILATERAL SALPINGECTOMY Bilateral 01/11/2013   Procedure: LAPAROSCOPIC BILATERAL SALPINGECTOMY;  Surgeon: Delice Lesch, MD;  Location: [facility];  Service: Gynecology;  Laterality: Bilateral;  . VAGINAL HYSTERECTOMY N/A 08/24/2015   Procedure: TOTAL VAGINAL HYSTERECTOMY Bilateral Salpingectomy;  Surgeon: Everett Graff, MD;  Location: [facility];  Service: Gynecology;  Laterality: N/A;  . WISDOM TOOTH EXTRACTION      Social History:  reports that she has never smoked. She has never used smokeless tobacco. She reports that she does not drink alcohol or use drugs.  Allergies: No  Known Allergies  Family History:  No history of heart disease, cancer, stroke that she is aware of.  Current Outpatient Medications:  Marland Kitchen  Multiple Vitamins-Minerals (MULTIVITAMIN WITH MINERALS) tablet, Take 1 tablet by mouth daily., Disp: , Rfl:   Review of Systems:  Constitutional: Denies fever, chills, diaphoresis, appetite change and fatigue.  HEENT: Denies photophobia, eye pain, redness, hearing loss, ear pain, congestion, sore throat, rhinorrhea, sneezing, mouth sores, trouble swallowing, neck pain, neck stiffness and tinnitus.   Respiratory: Denies SOB, DOE, cough, chest tightness,  and wheezing.   Cardiovascular: Denies chest pain, palpitations and leg swelling.  Gastrointestinal: Denies nausea, vomiting, abdominal pain, diarrhea, constipation, blood in stool and abdominal distention.  Genitourinary: Denies dysuria, urgency, frequency, hematuria, flank pain and difficulty urinating.  Endocrine: Denies: hot or cold intolerance, sweats, changes in hair or nails, polyuria, polydipsia. Musculoskeletal: Denies myalgias, back pain, joint swelling, arthralgias and gait problem.  Skin: Denies pallor, rash and wound.  Neurological: Denies dizziness, seizures, syncope, weakness, light-headedness, numbness and headaches.  Hematological: Denies adenopathy. Easy bruising, personal or family bleeding history  Psychiatric/Behavioral: Denies suicidal ideation, mood changes, confusion, nervousness, sleep disturbance and agitation    Physical Exam: Vitals:   11/15/18 0655  BP: (!) 130/96  Pulse: 70  Temp: 97.6 F (36.4 C)  TempSrc: Temporal  SpO2: 98%  Weight: 209 lb 6.4 oz (95 kg)  Height: '5\' 6"'  (1.676 m)    Body mass index is 33.8 kg/m.   Constitutional: NAD, calm, comfortable Eyes: PERRL,  lids and conjunctivae normal ENMT: Mucous membranes are moist.Tympanic membrane is pearly white, no erythema or bulging. Neck: normal, supple, no masses, no thyromegaly Respiratory: clear to  auscultation bilaterally, no wheezing, no crackles. Normal respiratory effort. No accessory muscle use.  Cardiovascular: Regular rate and rhythm, no murmurs / rubs / gallops. No extremity edema. 2+ pedal pulses. No carotid bruits.  Abdomen: no tenderness, no masses palpated. No hepatosplenomegaly. Bowel sounds positive.  Musculoskeletal: no clubbing / cyanosis. No joint deformity upper and lower extremities. Good ROM, no contractures. Normal muscle tone.  Skin: no rashes, lesions, ulcers. No induration Neurologic: CN 2-12 grossly intact. Sensation intact, DTR normal. Strength 5/5 in all 4.  Psychiatric: Normal judgment and insight. Alert and oriented x 3. Normal mood.      Office Visit from 11/15/2018 in [facility]  PHQ-9 Total Score  0       Impression and Plan:  Encounter for preventive health examination  -She has routine dental care, have recommended routine eye care. -She is due for tetanus and flu vaccinations.  Is willing to receive tetanus but would like to defer flu today. -Healthy lifestyle has been discussed in detail. -Screening labs to be requested today. -She is due for a screening mammogram, she states she will schedule. -Commence routine colon cancer screening at age 41. -She has a GYN whom she sees routinely and they keep up with Pap smears.  Elevated BP without diagnosis of hypertension -Blood pressure is 130/96, 140/100 on repeat. -She has never been diagnosed as having hypertension in the past. -Have advised ambulatory blood pressure monitoring, low-salt diet, she will return in 3 months for follow-up.  Obesity (BMI 30.0-34.9)  -Discussed healthy lifestyle, including increased physical activity and better food choices to promote weight loss.  Encounter for screening for HIV  - Plan: HIV antibody (with reflex)  Hematuria, unspecified type  -Check UA to assure this has resolved.  Lipoma of torso -Of her right upper back, is small, does  not bother her. -Would recommend removal only for pain or cosmetic reasons.    Patient Instructions  -Nice seeing you today!!  -Lab work today; will notify you once results are available.  -Tetanus vaccine today.  -Consider receiving flu vaccine next visit.  -Remember to schedule your mammogram.  -Check blood pressure at home 2-3 a week and bring number in to your next visit.  -Schedule follow up in 3 months.   Preventive Care 73-47 Years Old, Female Preventive care refers to visits with your health care provider and lifestyle choices that can promote health and wellness. This includes:  A yearly physical exam. This may also be called an annual well check.  Regular dental visits and eye exams.  Immunizations.  Screening for certain conditions.  Healthy lifestyle choices, such as eating a healthy diet, getting regular exercise, not using drugs or products that contain nicotine and tobacco, and limiting alcohol use. What can I expect for my preventive care visit? Physical exam Your health care provider will check your:  Height and weight. This may be used to calculate body mass index (BMI), which tells if you are at a healthy weight.  Heart rate and blood pressure.  Skin for abnormal spots. Counseling Your health care provider may ask you questions about your:  Alcohol, tobacco, and drug use.  Emotional well-being.  Home and relationship well-being.  Sexual activity.  Eating habits.  Work and work Statistician.  Method of birth control.  Menstrual cycle.  Pregnancy history.  What immunizations do I need?  Influenza (flu) vaccine  This is recommended every year. Tetanus, diphtheria, and pertussis (Tdap) vaccine  You may need a Td booster every 10 years. Varicella (chickenpox) vaccine  You may need this if you have not been vaccinated. Zoster (shingles) vaccine  You may need this after age 48. Measles, mumps, and rubella (MMR) vaccine  You may  need at least one dose of MMR if you were born in 1957 or later. You may also need a second dose. Pneumococcal conjugate (PCV13) vaccine  You may need this if you have certain conditions and were not previously vaccinated. Pneumococcal polysaccharide (PPSV23) vaccine  You may need one or two doses if you smoke cigarettes or if you have certain conditions. Meningococcal conjugate (MenACWY) vaccine  You may need this if you have certain conditions. Hepatitis A vaccine  You may need this if you have certain conditions or if you travel or work in places where you may be exposed to hepatitis A. Hepatitis B vaccine  You may need this if you have certain conditions or if you travel or work in places where you may be exposed to hepatitis B. Haemophilus influenzae type b (Hib) vaccine  You may need this if you have certain conditions. Human papillomavirus (HPV) vaccine  If recommended by your health care provider, you may need three doses over 6 months. You may receive vaccines as individual doses or as more than one vaccine together in one shot (combination vaccines). Talk with your health care provider about the risks and benefits of combination vaccines. What tests do I need? Blood tests  Lipid and cholesterol levels. These may be checked every 5 years, or more frequently if you are over 53 years old.  Hepatitis C test.  Hepatitis B test. Screening  Lung cancer screening. You may have this screening every year starting at age 42 if you have a 30-pack-year history of smoking and currently smoke or have quit within the past 15 years.  Colorectal cancer screening. All adults should have this screening starting at age 101 and continuing until age 56. Your health care provider may recommend screening at age 74 if you are at increased risk. You will have tests every 1-10 years, depending on your results and the type of screening test.  Diabetes screening. This is done by checking your blood  sugar (glucose) after you have not eaten for a while (fasting). You may have this done every 1-3 years.  Mammogram. This may be done every 1-2 years. Talk with your health care provider about when you should start having regular mammograms. This may depend on whether you have a family history of breast cancer.  BRCA-related cancer screening. This may be done if you have a family history of breast, ovarian, tubal, or peritoneal cancers.  Pelvic exam and Pap test. This may be done every 3 years starting at age 53. Starting at age 63, this may be done every 5 years if you have a Pap test in combination with an HPV test. Other tests  Sexually transmitted disease (STD) testing.  Bone density scan. This is done to screen for osteoporosis. You may have this scan if you are at high risk for osteoporosis. Follow these instructions at home: Eating and drinking  Eat a diet that includes fresh fruits and vegetables, whole grains, lean protein, and low-fat dairy.  Take vitamin and mineral supplements as recommended by your health care provider.  Do not drink alcohol if: ? Your health  care provider tells you not to drink. ? You are pregnant, may be pregnant, or are planning to become pregnant.  If you drink alcohol: ? Limit how much you have to 0-1 drink a day. ? Be aware of how much alcohol is in your drink. In the [place] one drink equals one 12 oz bottle of beer (355 mL), one 5 oz glass of wine (148 mL), or one 1 oz glass of hard liquor (44 mL). Lifestyle  Take daily care of your teeth and gums.  Stay active. Exercise for at least 30 minutes on 5 or more days each week.  Do not use any products that contain nicotine or tobacco, such as cigarettes, e-cigarettes, and chewing tobacco. If you need help quitting, ask your health care provider.  If you are sexually active, practice safe sex. Use a condom or other form of birth control (contraception) in order to prevent pregnancy and STIs (sexually  transmitted infections).  If told by your health care provider, take low-dose aspirin daily starting at age 42. What's next?  Visit your health care provider once a year for a well check visit.  Ask your health care provider how often you should have your eyes and teeth checked.  Stay up to date on all vaccines. This information is not intended to replace advice given to you by your health care provider. Make sure you discuss any questions you have with your health care provider. Document Released: 03/27/2015 Document Revised: 11/09/2017 Document Reviewed: 11/09/2017 Elsevier Patient Education  2020 [place], MD [facility]

## 2018-11-15 NOTE — Addendum Note (Signed)
Addended by: Elmer Picker on: 11/15/2018 07:27 AM   Modules accepted: Orders

## 2018-11-15 NOTE — Patient Instructions (Signed)
-Nice seeing you today!!  -Lab work today; will notify you once results are available.  -Tetanus vaccine today.  -Consider receiving flu vaccine next visit.  -Remember to schedule your mammogram.  -Check blood pressure at home 2-3 a week and bring number in to your next visit.  -Schedule follow up in 3 months.   Preventive Care 71-41 Years Old, Female Preventive care refers to visits with your health care provider and lifestyle choices that can promote health and wellness. This includes:  A yearly physical exam. This may also be called an annual well check.  Regular dental visits and eye exams.  Immunizations.  Screening for certain conditions.  Healthy lifestyle choices, such as eating a healthy diet, getting regular exercise, not using drugs or products that contain nicotine and tobacco, and limiting alcohol use. What can I expect for my preventive care visit? Physical exam Your health care provider will check your:  Height and weight. This may be used to calculate body mass index (BMI), which tells if you are at a healthy weight.  Heart rate and blood pressure.  Skin for abnormal spots. Counseling Your health care provider may ask you questions about your:  Alcohol, tobacco, and drug use.  Emotional well-being.  Home and relationship well-being.  Sexual activity.  Eating habits.  Work and work Statistician.  Method of birth control.  Menstrual cycle.  Pregnancy history. What immunizations do I need?  Influenza (flu) vaccine  This is recommended every year. Tetanus, diphtheria, and pertussis (Tdap) vaccine  You may need a Td booster every 10 years. Varicella (chickenpox) vaccine  You may need this if you have not been vaccinated. Zoster (shingles) vaccine  You may need this after age 62. Measles, mumps, and rubella (MMR) vaccine  You may need at least one dose of MMR if you were born in 1957 or later. You may also need a second dose.  Pneumococcal conjugate (PCV13) vaccine  You may need this if you have certain conditions and were not previously vaccinated. Pneumococcal polysaccharide (PPSV23) vaccine  You may need one or two doses if you smoke cigarettes or if you have certain conditions. Meningococcal conjugate (MenACWY) vaccine  You may need this if you have certain conditions. Hepatitis A vaccine  You may need this if you have certain conditions or if you travel or work in places where you may be exposed to hepatitis A. Hepatitis B vaccine  You may need this if you have certain conditions or if you travel or work in places where you may be exposed to hepatitis B. Haemophilus influenzae type b (Hib) vaccine  You may need this if you have certain conditions. Human papillomavirus (HPV) vaccine  If recommended by your health care provider, you may need three doses over 6 months. You may receive vaccines as individual doses or as more than one vaccine together in one shot (combination vaccines). Talk with your health care provider about the risks and benefits of combination vaccines. What tests do I need? Blood tests  Lipid and cholesterol levels. These may be checked every 5 years, or more frequently if you are over 78 years old.  Hepatitis C test.  Hepatitis B test. Screening  Lung cancer screening. You may have this screening every year starting at age 100 if you have a 30-pack-year history of smoking and currently smoke or have quit within the past 15 years.  Colorectal cancer screening. All adults should have this screening starting at age 4 and continuing until age 49.  Your health care provider may recommend screening at age 60 if you are at increased risk. You will have tests every 1-10 years, depending on your results and the type of screening test.  Diabetes screening. This is done by checking your blood sugar (glucose) after you have not eaten for a while (fasting). You may have this done every 1-3  years.  Mammogram. This may be done every 1-2 years. Talk with your health care provider about when you should start having regular mammograms. This may depend on whether you have a family history of breast cancer.  BRCA-related cancer screening. This may be done if you have a family history of breast, ovarian, tubal, or peritoneal cancers.  Pelvic exam and Pap test. This may be done every 3 years starting at age 21. Starting at age 64, this may be done every 5 years if you have a Pap test in combination with an HPV test. Other tests  Sexually transmitted disease (STD) testing.  Bone density scan. This is done to screen for osteoporosis. You may have this scan if you are at high risk for osteoporosis. Follow these instructions at home: Eating and drinking  Eat a diet that includes fresh fruits and vegetables, whole grains, lean protein, and low-fat dairy.  Take vitamin and mineral supplements as recommended by your health care provider.  Do not drink alcohol if: ? Your health care provider tells you not to drink. ? You are pregnant, may be pregnant, or are planning to become pregnant.  If you drink alcohol: ? Limit how much you have to 0-1 drink a day. ? Be aware of how much alcohol is in your drink. In the [place] one drink equals one 12 oz bottle of beer (355 mL), one 5 oz glass of wine (148 mL), or one 1 oz glass of hard liquor (44 mL). Lifestyle  Take daily care of your teeth and gums.  Stay active. Exercise for at least 30 minutes on 5 or more days each week.  Do not use any products that contain nicotine or tobacco, such as cigarettes, e-cigarettes, and chewing tobacco. If you need help quitting, ask your health care provider.  If you are sexually active, practice safe sex. Use a condom or other form of birth control (contraception) in order to prevent pregnancy and STIs (sexually transmitted infections).  If told by your health care provider, take low-dose aspirin daily  starting at age 64. What's next?  Visit your health care provider once a year for a well check visit.  Ask your health care provider how often you should have your eyes and teeth checked.  Stay up to date on all vaccines. This information is not intended to replace advice given to you by your health care provider. Make sure you discuss any questions you have with your health care provider. Document Released: 03/27/2015 Document Revised: 11/09/2017 Document Reviewed: 11/09/2017 Elsevier Patient Education  2020 [employer].

## 2018-11-16 LAB — HIV ANTIBODY (ROUTINE TESTING W REFLEX): HIV 1&2 Ab, 4th Generation: NONREACTIVE

## 2019-01-02 ENCOUNTER — Encounter: Payer: Self-pay | Admitting: Internal Medicine

## 2019-01-02 ENCOUNTER — Ambulatory Visit: Payer: BC Managed Care – PPO | Admitting: Internal Medicine

## 2019-01-02 ENCOUNTER — Other Ambulatory Visit: Payer: Self-pay

## 2019-01-02 VITALS — BP 130/100 | HR 88 | Temp 97.9°F | Wt 209.0 lb

## 2019-01-02 DIAGNOSIS — Z23 Encounter for immunization: Secondary | ICD-10-CM

## 2019-01-02 DIAGNOSIS — M25511 Pain in right shoulder: Secondary | ICD-10-CM

## 2019-01-02 DIAGNOSIS — R030 Elevated blood-pressure reading, without diagnosis of hypertension: Secondary | ICD-10-CM | POA: Diagnosis not present

## 2019-01-02 NOTE — Progress Notes (Signed)
Acute Office Visit     CC/Reason for Visit: Right shoulder pain, wants flu vaccine  HPI: Teresa Freeman is a 43 y.o. female who is coming in today for the above mentioned reasons.She would like her right shoulder pain evaluated. She started noticing pain around the end of July, no discrete injury. It "feels achy". Has full ROM. No issues combing her hair, reaching high cabinets or putting on seatbelt. Tylenol has not helped. She would like her flu shot today.  Past Medical/Surgical History: Past Medical History:  Diagnosis Date  . Anemia   . Ganglion cyst of wrist 05/2012   right  . Hypertension     Past Surgical History:  Procedure Laterality Date  . CESAREAN SECTION  11/25/2005  . CYSTOSCOPY N/A 08/24/2015   Procedure: CYSTOSCOPY;  Surgeon: Everett Graff, MD;  Location: [facility];  Service: Gynecology;  Laterality: N/A;  . GANGLION CYST EXCISION Right 06/04/2012   Procedure: RIGHT DORSAL GANGLION EXCISION OF WRIST;  Surgeon: Jolyn Nap, MD;  Location: [facility];  Service: Orthopedics;  Laterality: Right;  . LAPAROSCOPIC BILATERAL SALPINGECTOMY Bilateral 01/11/2013   Procedure: LAPAROSCOPIC BILATERAL SALPINGECTOMY;  Surgeon: Delice Lesch, MD;  Location: [facility];  Service: Gynecology;  Laterality: Bilateral;  . VAGINAL HYSTERECTOMY N/A 08/24/2015   Procedure: TOTAL VAGINAL HYSTERECTOMY Bilateral Salpingectomy;  Surgeon: Everett Graff, MD;  Location: [facility];  Service: Gynecology;  Laterality: N/A;  . WISDOM TOOTH EXTRACTION      Social History:  reports that she has never smoked. She has never used smokeless tobacco. She reports that she does not drink alcohol or use drugs.  Allergies: No Known Allergies  Family History:  No h/o heart disease, cancer or stroke  Current Outpatient Medications:  Marland Kitchen  Multiple Vitamins-Minerals (MULTIVITAMIN WITH MINERALS) tablet, Take 1 tablet by mouth daily., Disp: , Rfl:   Review of Systems:  Constitutional:  Denies fever, chills, diaphoresis, appetite change and fatigue.  HEENT: Denies photophobia, eye pain, redness, hearing loss, ear pain, congestion, sore throat, rhinorrhea, sneezing, mouth sores, trouble swallowing, neck pain, neck stiffness and tinnitus.   Respiratory: Denies SOB, DOE, cough, chest tightness,  and wheezing.   Cardiovascular: Denies chest pain, palpitations and leg swelling.  Gastrointestinal: Denies nausea, vomiting, abdominal pain, diarrhea, constipation, blood in stool and abdominal distention.  Genitourinary: Denies dysuria, urgency, frequency, hematuria, flank pain and difficulty urinating.  Endocrine: Denies: hot or cold intolerance, sweats, changes in hair or nails, polyuria, polydipsia. Musculoskeletal: Denies myalgias, back pain, joint swelling and gait problem.  Skin: Denies pallor, rash and wound.  Neurological: Denies dizziness, seizures, syncope, weakness, light-headedness, numbness and headaches.  Hematological: Denies adenopathy. Easy bruising, personal or family bleeding history  Psychiatric/Behavioral: Denies suicidal ideation, mood changes, confusion, nervousness, sleep disturbance and agitation    Physical Exam: Vitals:   01/02/19 0750  BP: (!) 130/100  Pulse: 88  Temp: 97.9 F (36.6 C)  TempSrc: Temporal  SpO2: 98%  Weight: 209 lb (94.8 kg)    Body mass index is 33.73 kg/m.    Constitutional: NAD, calm, comfortable Eyes: PERRL, lids and conjunctivae normal ENMT: Mucous membranes are moist. Abdomen: no tenderness, no masses palpated. No hepatosplenomegaly. Bowel sounds positive.  Musculoskeletal: no clubbing / cyanosis. No joint deformity upper and lower extremities. Right shoulder: Good ROM, no contractures. Normal muscle tone. Pain to palpation of the trapezius muscle. Psychiatric: Normal judgment and insight. Alert and oriented x 3. Normal mood.    Impression and Plan:  Elevated BP without diagnosis of hypertension -BP again elevated in  office today. -Ambulatory monitoring and follow up in 8 weeks in office.  Acute pain of right shoulder -Has full ROM, do not believe imaging is necessary today. -Suspect more tightness of trpezius muscle than anything else (she has been working from home on the computer since [place]). -Advised icing, ibuprofen, local massage therapy.  -RTC if no improvement.   Patient Instructions  -Nice seeing you today!!  -Check blood pressure at home 2-3 times a week and bring those numbers in to your next visit.  -Flu vaccine today.  -Ice twice a day for 15 mins, ibuprofen 2 tabs twice a day for 5 days and massage therapy to neck and upper back. Please let me know if not better.  -Schedule follow up in 8 weeks.     Lelon Frohlich, MD [facility]

## 2019-01-02 NOTE — Patient Instructions (Signed)
-  Nice seeing you today!!  -Check blood pressure at home 2-3 times a week and bring those numbers in to your next visit.  -Flu vaccine today.  -Ice twice a day for 15 mins, ibuprofen 2 tabs twice a day for 5 days and massage therapy to neck and upper back. Please let me know if not better.  -Schedule follow up in 8 weeks.

## 2019-02-18 LAB — HM MAMMOGRAPHY

## 2019-11-26 ENCOUNTER — Encounter: Payer: Self-pay | Admitting: Internal Medicine

## 2019-11-26 ENCOUNTER — Telehealth (INDEPENDENT_AMBULATORY_CARE_PROVIDER_SITE_OTHER): Payer: BC Managed Care – PPO | Admitting: Internal Medicine

## 2019-11-26 DIAGNOSIS — L700 Acne vulgaris: Secondary | ICD-10-CM | POA: Diagnosis not present

## 2019-11-26 NOTE — Progress Notes (Signed)
Virtual Visit via Video Note  I connected with Teresa Freeman on 11/26/19 at  4:00 PM EDT by a video enabled telemedicine application and verified that I am speaking with the correct person using two identifiers.  Location patient: home Location provider: work office Persons participating in the virtual visit: patient, provider  I discussed the limitations of evaluation and management by telemedicine and the availability of in person appointments. The patient expressed understanding and agreed to proceed.   HPI: Last Friday, 4 days ago she noticed a redness on the side of her nose.  She subsequently noticed a "white bump" that then started draining fluid.  There was some crustiness that then fell off and now her nose is back to normal.  This has happened in the past and has resolved without treatment.   ROS: Constitutional: Denies fever, chills, diaphoresis, appetite change and fatigue.  HEENT: Denies photophobia, eye pain, redness, hearing loss, ear pain, congestion, sore throat, rhinorrhea, sneezing, mouth sores, trouble swallowing, neck pain, neck stiffness and tinnitus.   Respiratory: Denies SOB, DOE, cough, chest tightness,  and wheezing.   Cardiovascular: Denies chest pain, palpitations and leg swelling.  Gastrointestinal: Denies nausea, vomiting, abdominal pain, diarrhea, constipation, blood in stool and abdominal distention.  Genitourinary: Denies dysuria, urgency, frequency, hematuria, flank pain and difficulty urinating.  Endocrine: Denies: hot or cold intolerance, sweats, changes in hair or nails, polyuria, polydipsia. Musculoskeletal: Denies myalgias, back pain, joint swelling, arthralgias and gait problem.  Skin: Denies pallor, rash and wound.  Neurological: Denies dizziness, seizures, syncope, weakness, light-headedness, numbness and headaches.  Hematological: Denies adenopathy. Easy bruising, personal or family bleeding history  Psychiatric/Behavioral: Denies  suicidal ideation, mood changes, confusion, nervousness, sleep disturbance and agitation   Past Medical History:  Diagnosis Date  . Anemia   . Ganglion cyst of wrist 05/2012   right  . Hypertension     Past Surgical History:  Procedure Laterality Date  . CESAREAN SECTION  11/25/2005  . CYSTOSCOPY N/A 08/24/2015   Procedure: CYSTOSCOPY;  Surgeon: Everett Graff, MD;  Location: [facility];  Service: Gynecology;  Laterality: N/A;  . GANGLION CYST EXCISION Right 06/04/2012   Procedure: RIGHT DORSAL GANGLION EXCISION OF WRIST;  Surgeon: Jolyn Nap, MD;  Location: [facility];  Service: Orthopedics;  Laterality: Right;  . LAPAROSCOPIC BILATERAL SALPINGECTOMY Bilateral 01/11/2013   Procedure: LAPAROSCOPIC BILATERAL SALPINGECTOMY;  Surgeon: Delice Lesch, MD;  Location: [facility];  Service: Gynecology;  Laterality: Bilateral;  . VAGINAL HYSTERECTOMY N/A 08/24/2015   Procedure: TOTAL VAGINAL HYSTERECTOMY Bilateral Salpingectomy;  Surgeon: Everett Graff, MD;  Location: [facility];  Service: Gynecology;  Laterality: N/A;  . WISDOM TOOTH EXTRACTION      No family history on file.  SOCIAL HX:   reports that she has never smoked. She has never used smokeless tobacco. She reports that she does not drink alcohol and does not use drugs.   Current Outpatient Medications:  Marland Kitchen  Multiple Vitamins-Minerals (MULTIVITAMIN WITH MINERALS) tablet, Take 1 tablet by mouth daily., Disp: , Rfl:   EXAM:   VITALS per patient if applicable: none reported  GENERAL: alert, oriented, appears well and in no acute distress  HEENT: atraumatic, conjunttiva clear, no obvious abnormalities on inspection of external nose and ears  NECK: normal movements of the head and neck  LUNGS: on inspection no signs of respiratory distress, breathing rate appears normal, no obvious gross increased work of breathing, gasping or wheezing  CV: no obvious cyanosis  MS:  moves all visible extremities without noticeable  abnormality  PSYCH/NEURO: pleasant and cooperative, no obvious depression or anxiety, speech and thought processing grossly intact  Skin: Patient has submitted this pictures from earlier this week:        ASSESSMENT AND PLAN:   White head/Acne -Have advised cleansing face 2-3 a week with a good quality scrub to remove makeup, oil and impurities from her T zone. -She will contact me if any continued issues.    I discussed the assessment and treatment plan with the patient. The patient was provided an opportunity to ask questions and all were answered. The patient agreed with the plan and demonstrated an understanding of the instructions.   The patient was advised to call back or seek an in-person evaluation if the symptoms worsen or if the condition fails to improve as anticipated.    Lelon Frohlich, MD  [facility] at [facility]

## 2020-03-12 ENCOUNTER — Encounter: Payer: Self-pay | Admitting: Internal Medicine

## 2021-01-19 ENCOUNTER — Encounter: Payer: Self-pay | Admitting: Family Medicine

## 2021-01-19 ENCOUNTER — Telehealth (INDEPENDENT_AMBULATORY_CARE_PROVIDER_SITE_OTHER): Payer: BC Managed Care – PPO | Admitting: Family Medicine

## 2021-01-19 VITALS — Ht 66.5 in | Wt 218.0 lb

## 2021-01-19 DIAGNOSIS — H02843 Edema of right eye, unspecified eyelid: Secondary | ICD-10-CM

## 2021-01-19 DIAGNOSIS — H5789 Other specified disorders of eye and adnexa: Secondary | ICD-10-CM

## 2021-01-19 MED ORDER — ERYTHROMYCIN 5 MG/GM OP OINT: 1.0000 "application " | TOPICAL_OINTMENT | Freq: Three times a day (TID) | OPHTHALMIC | 0 refills | Status: DC

## 2021-01-19 NOTE — Patient Instructions (Signed)
-I sent the medication(s) we discussed to your pharmacy: Meds ordered this encounter  Medications   erythromycin ophthalmic ointment    Sig: Place 1 application into the right eye 3 (three) times daily.    Dispense:  3.5 g    Refill:  0   Compresses  I hope you are feeling better soon!  Seek in person care promptly if your symptoms worsen, new concerns arise or you are not improving with treatment.  It was nice to meet you today. I help [facility] out with telemedicine visits on Tuesdays and Thursdays and am available for visits on those days. If you have any concerns or questions following this visit please schedule a follow up visit with your Primary Care doctor or seek care at a local urgent care clinic to avoid delays in care.    Stye A stye, also known as a hordeolum, is a bump that forms on an eyelid. It may look like a pimple next to the eyelash. A stye can form inside the eyelid (internal stye) or outside the eyelid (external stye). A stye can cause redness, swelling, and pain on the eyelid. Styes are very common. Anyone can get them at any age. They usually occur in just one eye at a time, but you may have more than one in either eye. What are the causes? A stye is caused by an infection. The infection is almost always caused by bacteria called Staphylococcus aureus. This is a common type of bacteria that lives on the skin. An internal stye may result from an infected oil-producing gland inside the eyelid. An external stye may be caused by an infection at the base of the eyelash (hair follicle). What increases the risk? You are more likely to develop a stye if: You have had a stye before. You have any of these conditions: Red, itchy, inflamed eyelids (blepharitis). A skin condition such as seborrheic dermatitis or rosacea. High fat levels in your blood (lipids). Dry eyes. What are the signs or symptoms? The most common symptom of a stye is eyelid pain. Internal styes are more  painful than external styes. Other symptoms may include: Painful swelling of your eyelid. A scratchy feeling in your eye. Tearing and redness of your eye. A pimple-like bump on the edge of the eyelid. Pus draining from the stye. How is this diagnosed? Your health care provider may be able to diagnose a stye just by examining your eye. The health care provider may also check to make sure: You do not have a fever or other signs of a more serious infection. The infection has not spread to other parts of your eye or areas around your eye. How is this treated? Most styes will clear up in a few days without treatment or with warm compresses applied to the area. You may need to use antibiotic drops or ointment to treat an infection. Sometimes, steroid drops or ointment are used in addition to antibiotics. In some cases, your health care provider may give you a small steroid injection in the eyelid. If your stye does not heal with routine treatment, your health care provider may drain pus from the stye using a thin blade or needle. This may be done if the stye is large, causing a lot of pain, or affecting your vision. Follow these instructions at home: Take over-the-counter and prescription medicines only as told by your health care provider. This includes eye drops or ointments. If you were prescribed an antibiotic medicine, steroid medicine, or  both, apply or use them as told by your health care provider. Do not stop using the medicine even if your condition improves. Apply a warm, wet cloth (warm compress) to your eye for 5-10 minutes, 4 to 6 times a day. Clean the affected eyelid as directed by your health care provider. Do not wear contact lenses or eye makeup until your stye has healed and your health care provider says that it is safe. Do not try to pop or drain the stye. Do not rub your eye. Contact a health care provider if: You have chills or a fever. Your stye does not go away after  several days. Your stye affects your vision. Your eyeball becomes swollen, red, or painful. Get help right away if: You have pain when moving your eye around. Summary A stye is a bump that forms on an eyelid. It may look like a pimple next to the eyelash. A stye can form inside the eyelid (internal stye) or outside the eyelid (external stye). A stye can cause redness, swelling, and pain on the eyelid. Your health care provider may be able to diagnose a stye just by examining your eye. Apply a warm, wet cloth (warm compress) to your eye for 5-10 minutes, 4 to 6 times a day. This information is not intended to replace advice given to you by your health care provider. Make sure you discuss any questions you have with your health care provider. Document Revised: 05/06/2020 Document Reviewed: 05/06/2020 Elsevier Patient Education  [place].

## 2021-01-19 NOTE — Progress Notes (Signed)
Virtual Visit via Video Note  I connected with Teresa Freeman  on 01/19/21 at  4:00 PM EST by a video enabled telemedicine application and verified that I am speaking with the correct person using two identifiers.  Location patient: home, [place] Location provider:work or home office Persons participating in the virtual visit: patient, provider  I discussed the limitations of evaluation and management by telemedicine and the availability of in person appointments. The patient expressed understanding and agreed to proceed.   HPI:  Acute telemedicine visit for a "stye": -Onset: 2 days ago -Symptoms include: swollen R lower lid of the eye, a little drainage form the L eye and some crusting -Denies:fevers, headache, malaise, eyeball pain, spreading edema, vision changes -Has tried: otc compresses, stye oint from the pharmacy -Pertinent medication allergies: No Known Allergies   ROS: See pertinent positives and negatives per HPI.  Past Medical History:  Diagnosis Date   Anemia    Ganglion cyst of wrist 05/2012   right   Hypertension     Past Surgical History:  Procedure Laterality Date   CESAREAN SECTION  11/25/2005   CYSTOSCOPY N/A 08/24/2015   Procedure: CYSTOSCOPY;  Surgeon: Everett Graff, MD;  Location: [facility];  Service: Gynecology;  Laterality: N/A;   GANGLION CYST EXCISION Right 06/04/2012   Procedure: RIGHT DORSAL GANGLION EXCISION OF WRIST;  Surgeon: Jolyn Nap, MD;  Location: [facility];  Service: Orthopedics;  Laterality: Right;   LAPAROSCOPIC BILATERAL SALPINGECTOMY Bilateral 01/11/2013   Procedure: LAPAROSCOPIC BILATERAL SALPINGECTOMY;  Surgeon: Delice Lesch, MD;  Location: [facility];  Service: Gynecology;  Laterality: Bilateral;   VAGINAL HYSTERECTOMY N/A 08/24/2015   Procedure: TOTAL VAGINAL HYSTERECTOMY Bilateral Salpingectomy;  Surgeon: Everett Graff, MD;  Location: [facility];  Service: Gynecology;  Laterality: N/A;   WISDOM TOOTH EXTRACTION       Current  Outpatient Medications:    Ascorbic Acid (VITAMIN C PO), Take 500 mg by mouth daily., Disp: , Rfl:    erythromycin ophthalmic ointment, Place 1 application into the right eye 3 (three) times daily., Disp: 3.5 g, Rfl: 0   Multiple Vitamins-Minerals (MULTIVITAMIN WITH MINERALS) tablet, Take 1 tablet by mouth daily., Disp: , Rfl:   EXAM:  VITALS per patient if applicable:  GENERAL: alert, oriented, appears well and in no acute distress  HEENT: atraumatic, conjunttiva clear, no obvious abnormalities on inspection of external nose and ears, small pea sized swelling/lump R lower eyelid  NECK: normal movements of the head and neck  LUNGS: on inspection no signs of respiratory distress, breathing rate appears normal, no obvious gross SOB, gasping or wheezing  CV: no obvious cyanosis  MS: moves all visible extremities without noticeable abnormality  PSYCH/NEURO: pleasant and cooperative, no obvious depression or anxiety, speech and thought processing grossly intact  ASSESSMENT AND PLAN:  Discussed the following assessment and plan:  Swelling of right eyelid  Eye drainage  -we discussed possible serious and likely etiologies, options for evaluation and workup, limitations of telemedicine visit vs in person visit, treatment, treatment risks and precautions. Pt is agreeable to treatment via telemedicine at this moment. Query stye vs other. Given some drainage/crusting opted for trial erythro optho ointment, compresses. Advised to seek prompt in person care if worsening, new symptoms arise, or if is not improving with treatment.    I discussed the assessment and treatment plan with the patient. The patient was provided an opportunity to ask questions and all were answered. The patient agreed with the plan and demonstrated an  understanding of the instructions.     Lucretia Kern, DO

## 2021-05-06 ENCOUNTER — Ambulatory Visit: Payer: BC Managed Care – PPO | Admitting: Internal Medicine

## 2021-05-06 ENCOUNTER — Encounter: Payer: Self-pay | Admitting: Internal Medicine

## 2021-05-06 VITALS — BP 124/76 | HR 89 | Temp 98.1°F | Ht 66.5 in | Wt 218.7 lb

## 2021-05-06 DIAGNOSIS — L700 Acne vulgaris: Secondary | ICD-10-CM

## 2021-05-06 NOTE — Progress Notes (Signed)
Acute Office Visit     This visit occurred during the SARS-CoV-2 public health emergency.  Safety protocols were in place, including screening questions prior to the visit, additional usage of staff PPE, and extensive cleaning of exam room while observing appropriate contact time as indicated for disinfecting solutions.    CC/Reason for Visit: Rash on face  HPI: Teresa Freeman is a 46 y.o. female who is coming in today for the above mentioned reasons. For about 18 months has noticed a white pustular lesion over her nose that will slowly resolve and get better. Can happen on sides and/or tip of her nose.   Past Medical/Surgical History: Past Medical History:  Diagnosis Date   Anemia    Ganglion cyst of wrist 05/2012   right   Hypertension     Past Surgical History:  Procedure Laterality Date   CESAREAN SECTION  11/25/2005   CYSTOSCOPY N/A 08/24/2015   Procedure: CYSTOSCOPY;  Surgeon: Everett Graff, MD;  Location: [facility];  Service: Gynecology;  Laterality: N/A;   GANGLION CYST EXCISION Right 06/04/2012   Procedure: RIGHT DORSAL GANGLION EXCISION OF WRIST;  Surgeon: Jolyn Nap, MD;  Location: [facility];  Service: Orthopedics;  Laterality: Right;   LAPAROSCOPIC BILATERAL SALPINGECTOMY Bilateral 01/11/2013   Procedure: LAPAROSCOPIC BILATERAL SALPINGECTOMY;  Surgeon: Delice Lesch, MD;  Location: [facility];  Service: Gynecology;  Laterality: Bilateral;   VAGINAL HYSTERECTOMY N/A 08/24/2015   Procedure: TOTAL VAGINAL HYSTERECTOMY Bilateral Salpingectomy;  Surgeon: Everett Graff, MD;  Location: [facility];  Service: Gynecology;  Laterality: N/A;   WISDOM TOOTH EXTRACTION      Social History:  reports that she has never smoked. She has never used smokeless tobacco. She reports that she does not drink alcohol and does not use drugs.  Allergies: No Known Allergies  Family History:  No history of heart disease, cancer, stroke that she is aware of   Current  Outpatient Medications:    Ascorbic Acid (VITAMIN C PO), Take 500 mg by mouth daily., Disp: , Rfl:    Multiple Vitamins-Minerals (MULTIVITAMIN WITH MINERALS) tablet, Take 1 tablet by mouth daily., Disp: , Rfl:    erythromycin ophthalmic ointment, Place 1 application into the right eye 3 (three) times daily. (Patient not taking: Reported on 05/06/2021), Disp: 3.5 g, Rfl: 0  Review of Systems:  Constitutional: Denies fever, chills, diaphoresis, appetite change and fatigue.  HEENT: Denies photophobia, eye pain, redness, hearing loss, ear pain, congestion, sore throat, rhinorrhea, sneezing, mouth sores, trouble swallowing, neck pain, neck stiffness and tinnitus.   Respiratory: Denies SOB, DOE, cough, chest tightness,  and wheezing.   Cardiovascular: Denies chest pain, palpitations and leg swelling.  Gastrointestinal: Denies nausea, vomiting, abdominal pain, diarrhea, constipation, blood in stool and abdominal distention.  Genitourinary: Denies dysuria, urgency, frequency, hematuria, flank pain and difficulty urinating.  Endocrine: Denies: hot or cold intolerance, sweats, changes in hair or nails, polyuria, polydipsia. Musculoskeletal: Denies myalgias, back pain, joint swelling, arthralgias and gait problem.  Skin: Denies pallor and wound.  Neurological: Denies dizziness, seizures, syncope, weakness, light-headedness, numbness and headaches.  Hematological: Denies adenopathy. Easy bruising, personal or family bleeding history  Psychiatric/Behavioral: Denies suicidal ideation, mood changes, confusion, nervousness, sleep disturbance and agitation    Physical Exam: Vitals:   05/06/21 1426  BP: 124/76  Pulse: 89  Temp: 98.1 F (36.7 C)  TempSrc: Oral  SpO2: 98%  Weight: 218 lb 11.2 oz (99.2 kg)  Height: 5' 6.5" (1.689 m)  Body mass index is 34.77 kg/m.   Constitutional: NAD, calm, comfortable Eyes: PERRL, lids and conjunctivae normal ENMT: Mucous membranes are moist.  Skin:  Acne/whiteheads along nose Neurologic: Grossly intact and nonfocal Psychiatric: Normal judgment and insight. Alert and oriented x 3. Normal mood.    Impression and Plan:  Acne vulgaris -We have discussed good OTC facial regimens for acne including a facial scrub and benzoyl peroxide based serums.  Time spent: 20 minutes reviewing chart, interviewing and examining patient and formulating plan of care.     Lelon Frohlich, MD [facility] at [facility]

## 2022-03-02 ENCOUNTER — Encounter: Payer: Self-pay | Admitting: Adult Health

## 2022-03-02 ENCOUNTER — Telehealth (INDEPENDENT_AMBULATORY_CARE_PROVIDER_SITE_OTHER): Payer: BC Managed Care – PPO | Admitting: Adult Health

## 2022-03-02 VITALS — HR 86 | Temp 97.3°F | Ht 66.5 in | Wt 218.0 lb

## 2022-03-02 DIAGNOSIS — U071 COVID-19: Secondary | ICD-10-CM | POA: Diagnosis not present

## 2022-03-02 NOTE — Progress Notes (Signed)
Virtual Visit via Video Note  I connected with Teresa Freeman on 03/02/22 at 10:15 AM EST by a video enabled telemedicine application and verified that I am speaking with the correct person using two identifiers.  Location patient: home Location provider:work or home office Persons participating in the virtual visit: patient, provider  I discussed the limitations of evaluation and management by telemedicine and the availability of in person appointments. The patient expressed understanding and agreed to proceed.   HPI: 46 year old female who is being evaluated today for COVID-19 infection.  Her symptoms started 5 days ago with stuffy nose, fatigue, and sore throat.  2 days after her symptoms started so worse that she felt.  She tested 2 days ago and received a positive result on her COVID-19 at home test.  Today she is feeling "much better" and the only remaining symptom is a mildly stuffy nose.  At home she has been using DayQuil cold and flu which seems to help.   ROS: See pertinent positives and negatives per HPI.  Past Medical History:  Diagnosis Date   Anemia    Ganglion cyst of wrist 05/2012   right   Hypertension     Past Surgical History:  Procedure Laterality Date   CESAREAN SECTION  11/25/2005   CYSTOSCOPY N/A 08/24/2015   Procedure: CYSTOSCOPY;  Surgeon: Everett Graff, MD;  Location: [facility];  Service: Gynecology;  Laterality: N/A;   GANGLION CYST EXCISION Right 06/04/2012   Procedure: RIGHT DORSAL GANGLION EXCISION OF WRIST;  Surgeon: Jolyn Nap, MD;  Location: [facility];  Service: Orthopedics;  Laterality: Right;   LAPAROSCOPIC BILATERAL SALPINGECTOMY Bilateral 01/11/2013   Procedure: LAPAROSCOPIC BILATERAL SALPINGECTOMY;  Surgeon: Delice Lesch, MD;  Location: [facility];  Service: Gynecology;  Laterality: Bilateral;   VAGINAL HYSTERECTOMY N/A 08/24/2015   Procedure: TOTAL VAGINAL HYSTERECTOMY Bilateral Salpingectomy;  Surgeon: Everett Graff, MD;   Location: [facility];  Service: Gynecology;  Laterality: N/A;   WISDOM TOOTH EXTRACTION      History reviewed. No pertinent family history.     Current Outpatient Medications:    Ascorbic Acid (VITAMIN C PO), Take 500 mg by mouth daily., Disp: , Rfl:    erythromycin ophthalmic ointment, Place 1 application into the right eye 3 (three) times daily. (Patient not taking: Reported on 05/06/2021), Disp: 3.5 g, Rfl: 0   Multiple Vitamins-Minerals (MULTIVITAMIN WITH MINERALS) tablet, Take 1 tablet by mouth daily., Disp: , Rfl:   EXAM:  VITALS per patient if applicable:  GENERAL: alert, oriented, appears well and in no acute distress  HEENT: atraumatic, conjunttiva clear, no obvious abnormalities on inspection of external nose and ears  NECK: normal movements of the head and neck  LUNGS: on inspection no signs of respiratory distress, breathing rate appears normal, no obvious gross SOB, gasping or wheezing  CV: no obvious cyanosis  MS: moves all visible extremities without noticeable abnormality  PSYCH/NEURO: pleasant and cooperative, no obvious depression or anxiety, speech and thought processing grossly intact  ASSESSMENT AND PLAN:  Discussed the following assessment and plan:  1. COVID-19 virus infection -She is at the tail end of 5-day window to start antiviral therapy.  Since she has improved significantly, advised against starting antiviral therapy.  She can continue to use over-the-counter cold and flu medication since this seems to be helping.  Advised to stay quarantine for another day and then she can leave the house, for the next 5 days wear the mask when she is outside of her  house or close proximity to family or friends.  Red flags reviewed, advised follow-up as needed     I discussed the assessment and treatment plan with the patient. The patient was provided an opportunity to ask questions and all were answered. The patient agreed with the plan and demonstrated an  understanding of the instructions.   The patient was advised to call back or seek an in-person evaluation if the symptoms worsen or if the condition fails to improve as anticipated.   Dorothyann Peng, NP

## 2022-07-26 LAB — HM PAP SMEAR

## 2022-07-28 ENCOUNTER — Encounter: Payer: Self-pay | Admitting: Internal Medicine

## 2022-11-16 ENCOUNTER — Encounter: Payer: Self-pay | Admitting: Family Medicine

## 2022-11-16 ENCOUNTER — Ambulatory Visit: Payer: BC Managed Care – PPO | Admitting: Family Medicine

## 2022-11-16 VITALS — BP 132/90 | HR 75 | Temp 98.6°F | Wt 229.0 lb

## 2022-11-16 DIAGNOSIS — R319 Hematuria, unspecified: Secondary | ICD-10-CM

## 2022-11-16 DIAGNOSIS — N390 Urinary tract infection, site not specified: Secondary | ICD-10-CM

## 2022-11-16 LAB — POC URINALSYSI DIPSTICK (AUTOMATED)
Bilirubin, UA: NEGATIVE
Glucose, UA: NEGATIVE
Ketones, UA: NEGATIVE
Nitrite, UA: NEGATIVE
Protein, UA: POSITIVE — AB
Spec Grav, UA: 1.015 (ref 1.010–1.025)
Urobilinogen, UA: 0.2 U/dL
pH, UA: 5.5 (ref 5.0–8.0)

## 2022-11-16 MED ORDER — CIPROFLOXACIN HCL 500 MG PO TABS: 500.0000 mg | ORAL_TABLET | Freq: Two times a day (BID) | ORAL | 0 refills | Status: DC

## 2022-11-16 NOTE — Progress Notes (Signed)
   Subjective:    Patient ID: Teresa Freeman, female    DOB: September 22, 1975, 47 y.o.   MRN: [id number]  HPI Here for 2 days of urinary urgency and blood in the urine. Now today this has stopped. No fever or back pain or burning.    Review of Systems  Constitutional: Negative.   Respiratory: Negative.    Cardiovascular: Negative.   Genitourinary:  Positive for hematuria and urgency. Negative for dysuria, flank pain and frequency.       Objective:   Physical Exam Constitutional:      Appearance: Normal appearance.  Cardiovascular:     Rate and Rhythm: Normal rate and regular rhythm.     Pulses: Normal pulses.     Heart sounds: Normal heart sounds.  Pulmonary:     Effort: Pulmonary effort is normal.     Breath sounds: Normal breath sounds.  Abdominal:     Tenderness: There is no right CVA tenderness or left CVA tenderness.  Neurological:     Mental Status: She is alert.           Assessment & Plan:  UTI, treat with 7 days of Cipro. Culture the sample.  Gershon Crane, MD

## 2022-11-19 LAB — URINE CULTURE
MICRO NUMBER:: 15426249
SPECIMEN QUALITY:: ADEQUATE

## 2024-01-11 LAB — HM PAP SMEAR

## 2024-01-18 ENCOUNTER — Encounter: Payer: Self-pay | Admitting: Internal Medicine

## 2024-02-06 ENCOUNTER — Ambulatory Visit: Admitting: Family Medicine

## 2024-02-06 ENCOUNTER — Ambulatory Visit: Payer: Self-pay

## 2024-02-06 ENCOUNTER — Encounter: Payer: Self-pay | Admitting: Family Medicine

## 2024-02-06 VITALS — BP 180/120 | Temp 98.3°F | Wt 238.0 lb

## 2024-02-06 DIAGNOSIS — I10 Essential (primary) hypertension: Secondary | ICD-10-CM | POA: Diagnosis not present

## 2024-02-06 MED ORDER — AMLODIPINE BESYLATE 5 MG PO TABS: 5.0000 mg | ORAL_TABLET | Freq: Every day | ORAL | 1 refills | Status: DC

## 2024-02-06 NOTE — Progress Notes (Signed)
 Established Patient Office Visit  Subjective   Patient ID: Teresa Freeman, female    DOB: 27-Jun-1975  Age: 48 y.o. MRN: [id number]  Chief Complaint  Patient presents with   Hypertension    HPI   Nico is seen today with several recent extremely high blood pressure readings.  No prior history of hypertension.  She recently went to the OB/GYN and had blood pressure run 160/90.  She then went to GI appointment earlier and had blood pressure 175/101.  He said some recent mild headache.  No visual changes.  No chest pains.  No peripheral edema.  No recent dietary changes.  No alcohol use.  Positive family history of hypertension in her father.  She currently does not take any regular medications.  She has not had any recent lab work.  Not taking any regular nonsteroidals.  Non-smoker.  Past Medical History:  Diagnosis Date   Anemia    Ganglion cyst of wrist 05/2012   right   Hypertension    Past Surgical History:  Procedure Laterality Date   CESAREAN SECTION  11/25/2005   CYSTOSCOPY N/A 08/24/2015   Procedure: CYSTOSCOPY;  Surgeon: Jon Rummer, MD;  Location: [facility];  Service: Gynecology;  Laterality: N/A;   GANGLION CYST EXCISION Right 06/04/2012   Procedure: RIGHT DORSAL GANGLION EXCISION OF WRIST;  Surgeon: Alm DELENA Hummer, MD;  Location: [facility];  Service: Orthopedics;  Laterality: Right;   LAPAROSCOPIC BILATERAL SALPINGECTOMY Bilateral 01/11/2013   Procedure: LAPAROSCOPIC BILATERAL SALPINGECTOMY;  Surgeon: Jon CINDERELLA Rummer, MD;  Location: [facility];  Service: Gynecology;  Laterality: Bilateral;   VAGINAL HYSTERECTOMY N/A 08/24/2015   Procedure: TOTAL VAGINAL HYSTERECTOMY Bilateral Salpingectomy;  Surgeon: Jon Rummer, MD;  Location: [facility];  Service: Gynecology;  Laterality: N/A;   WISDOM TOOTH EXTRACTION      reports that she has never smoked. She has never used smokeless tobacco. She reports that she does not drink alcohol and does not use drugs. family  history is not on file. No Known Allergies  Review of Systems  Constitutional:  Negative for weight loss.  Eyes:  Negative for blurred vision and double vision.  Respiratory:  Negative for shortness of breath.   Cardiovascular:  Negative for chest pain.  Neurological:  Positive for headaches. Negative for dizziness.      Objective:     BP (!) 180/120   Temp 98.3 F (36.8 C) (Oral)   Wt 238 lb (108 kg)   LMP 07/22/2015 (Exact Date)   BMI 37.84 kg/m  BP Readings from Last 3 Encounters:  02/06/24 (!) 180/120  11/16/22 (!) 132/90  05/06/21 124/76   Wt Readings from Last 3 Encounters:  02/06/24 238 lb (108 kg)  11/16/22 229 lb (103.9 kg)  03/02/22 218 lb (98.9 kg)      Physical Exam Vitals reviewed.  Constitutional:      General: She is not in acute distress.    Appearance: Normal appearance. She is not ill-appearing.  Eyes:     Pupils: Pupils are equal, round, and reactive to light.     Comments: No visible retinal hemorrhages.  Cardiovascular:     Rate and Rhythm: Normal rate and regular rhythm.  Pulmonary:     Effort: Pulmonary effort is normal.     Breath sounds: Normal breath sounds. No wheezing or rales.  Musculoskeletal:     Cervical back: Neck supple. Tenderness present.  Neurological:     Mental Status: She is alert.  No results found for any visits on 02/06/24.    The ASCVD Risk score (Arnett DK, et al., 2019) failed to calculate for the following reasons:   Cannot find a previous HDL lab   Cannot find a previous total cholesterol lab    Assessment & Plan:   Problem List Items Addressed This Visit   None Visit Diagnoses       Hypertension, unspecified type    -  Primary   Relevant Medications   amLODipine  (NORVASC ) 5 MG tablet   Other Relevant Orders   Lipid panel   CMP   TSH   CBC with Differential/Platelet     Patient presents with severe hypertension.  Not treated previously.  Very high reading here today of 180/120 and  repeat after rest left arm seated large cuff 184/118.  -Start amlodipine  5 mg daily.  Will probably need combination therapy to bring her close to goal. - Discussed importance of nonpharmacologic therapy with low-sodium diet with trying to keep dietary intake less than 2500 mg daily.  Handout given on DASH diet. - Avoid strenuous exercise until blood pressure better controlled - Long-term, discussed importance of losing some weight to help with management. - Obtain follow-up labs as above - Set up follow-up with primary in about 2 weeks to reassess blood pressure.  Consider addition of low-dose thiazide at follow-up if not much closer to goal  Return in about 2 weeks (around 02/20/2024).    Wolm Scarlet, MD

## 2024-02-06 NOTE — Patient Instructions (Signed)
 Set up follow up with Dr Theophilus in about 2 weeks.

## 2024-02-06 NOTE — Telephone Encounter (Signed)
 FYI Only or Action Required?: FYI only for provider: appointment scheduled on 11/25.  Patient was last seen in primary care on 11/16/2022 by Johnny Garnette LABOR, MD.  Called Nurse Triage reporting Hypertension.  Symptoms began today.  Interventions attempted: Nothing.  Symptoms are: stable.  Triage Disposition: See Physician Within 24 Hours  Patient/caregiver understands and will follow disposition?: Yes Copied from CRM #[id number]. Topic: Clinical - Red Word Triage >> Feb 06, 2024  1:27 PM Franky GRADE wrote: Red Word that prompted transfer to Nurse Triage: Patient is at a doctor's office and blood pressure is 175/110, patient is not showing symptoms but the provider is concerned due to the high reading. Reason for Disposition  Systolic BP >= 180 OR Diastolic >= 110  Answer Assessment - Initial Assessment Questions Patient is at GI appt to get colonoscopy scheduled. She is having elevated BP on exam and provider wanting her to be evaluated urgently. Denies any symptoms at this time (no fever, CP, SOB, Dizziness, Headache, Nausea or vomiting, or vision changes). Had mild headache this morning but unsure if related and has since resolved. ED precautions advised. Appt made with PCP office today 11/25 to assess  1. BLOOD PRESSURE: What is your blood pressure? Did you take at least two measurements 5 minutes apart?     175/110, 172/101 2. ONSET: When did you take your blood pressure?     Unsure- checked today at [facility] 3. HOW: How did you take your blood pressure? (e.g., automatic home BP monitor, visiting nurse)     At GI appt  4. HISTORY: Do you have a history of high blood pressure?     Slightly elevated in the past but never this bad 5. MEDICINES: Are you taking any medicines for blood pressure? Have you missed any doses recently?     denies 6. OTHER SYMPTOMS: Do you have any symptoms? (e.g., blurred vision, chest pain, difficulty breathing, headache, weakness)     Slight headache  this morning but resolved  Protocols used: Blood Pressure - High-A-AH

## 2024-02-06 NOTE — Telephone Encounter (Signed)
 Noted

## 2024-02-07 ENCOUNTER — Ambulatory Visit: Payer: Self-pay | Admitting: Family Medicine

## 2024-02-07 LAB — COMPREHENSIVE METABOLIC PANEL WITH GFR
ALT: 17 U/L (ref 0–35)
AST: 18 U/L (ref 0–37)
Albumin: 4.2 g/dL (ref 3.5–5.2)
Alkaline Phosphatase: 72 U/L (ref 39–117)
BUN: 12 mg/dL (ref 6–23)
CO2: 28 meq/L (ref 19–32)
Calcium: 9 mg/dL (ref 8.4–10.5)
Chloride: 102 meq/L (ref 96–112)
Creatinine, Ser: 0.67 mg/dL (ref 0.40–1.20)
GFR: 105.19 mL/min (ref >60.00)
Glucose, Bld: 70 mg/dL (ref 70–99)
Potassium: 3.4 meq/L — ABNORMAL LOW (ref 3.5–5.1)
Sodium: 138 meq/L (ref 135–145)
Total Bilirubin: 0.4 mg/dL (ref 0.2–1.2)
Total Protein: 7.8 g/dL (ref 6.0–8.3)

## 2024-02-07 LAB — CBC WITH DIFFERENTIAL/PLATELET
Basophils Absolute: 0.1 K/uL (ref 0.0–0.1)
Basophils Relative: 1.6 % (ref 0.0–3.0)
Eosinophils Absolute: 0.4 K/uL (ref 0.0–0.7)
Eosinophils Relative: 5.4 % — ABNORMAL HIGH (ref 0.0–5.0)
HCT: 38.3 % (ref 36.0–46.0)
Hemoglobin: 12.7 g/dL (ref 12.0–15.0)
Lymphocytes Relative: 21.2 % (ref 12.0–46.0)
Lymphs Abs: 1.6 K/uL (ref 0.7–4.0)
MCHC: 33.1 g/dL (ref 30.0–36.0)
MCV: 78.1 fl (ref 78.0–100.0)
Monocytes Absolute: 0.5 K/uL (ref 0.1–1.0)
Monocytes Relative: 7 % (ref 3.0–12.0)
Neutro Abs: 4.9 K/uL (ref 1.4–7.7)
Neutrophils Relative %: 64.8 % (ref 43.0–77.0)
Platelets: 335 K/uL (ref 150.0–400.0)
RBC: 4.9 Mil/uL (ref 3.87–5.11)
RDW: 14.2 % (ref 11.5–15.5)
WBC: 7.5 K/uL (ref 4.0–10.5)

## 2024-02-07 LAB — LIPID PANEL
Cholesterol: 131 mg/dL (ref 0–200)
HDL: 43.5 mg/dL (ref >39.00)
LDL Cholesterol: 60 mg/dL (ref 0–99)
NonHDL: 87.63
Total CHOL/HDL Ratio: 3
Triglycerides: 139 mg/dL (ref 0.0–149.0)
VLDL: 27.8 mg/dL (ref 0.0–40.0)

## 2024-02-07 LAB — TSH: TSH: 0.96 u[IU]/mL (ref 0.35–5.50)

## 2024-02-12 ENCOUNTER — Ambulatory Visit: Admitting: Internal Medicine

## 2024-02-20 ENCOUNTER — Ambulatory Visit: Admitting: Internal Medicine

## 2024-02-22 ENCOUNTER — Encounter: Payer: Self-pay | Admitting: Internal Medicine

## 2024-02-22 ENCOUNTER — Ambulatory Visit: Admitting: Internal Medicine

## 2024-02-22 VITALS — BP 144/93 | HR 73 | Temp 98.7°F | Wt 234.7 lb

## 2024-02-22 DIAGNOSIS — I10 Essential (primary) hypertension: Secondary | ICD-10-CM | POA: Diagnosis not present

## 2024-02-22 NOTE — Progress Notes (Signed)
 Established Patient Office Visit     CC/Reason for Visit: Blood pressure follow-up  HPI: Teresa Freeman is a 48 y.o. female who is coming in today for the above mentioned reasons. Past Medical History is significant for: Hypertension.  Seen 2 weeks ago by Dr. Micheal and started on amlodipine  5 mg due to significant elevated blood pressures with systolics in the 160-170 range and diastolics of 100.  She has been adherent to medical therapy, blood pressure in office today 134/98 and 144/93.   Past Medical/Surgical History: Past Medical History:  Diagnosis Date   Anemia    Ganglion cyst of wrist 05/2012   right   Hypertension     Past Surgical History:  Procedure Laterality Date   CESAREAN SECTION  11/25/2005   CYSTOSCOPY N/A 08/24/2015   Procedure: CYSTOSCOPY;  Surgeon: Jon Rummer, MD;  Location: [facility];  Service: Gynecology;  Laterality: N/A;   GANGLION CYST EXCISION Right 06/04/2012   Procedure: RIGHT DORSAL GANGLION EXCISION OF WRIST;  Surgeon: Alm DELENA Hummer, MD;  Location: [facility];  Service: Orthopedics;  Laterality: Right;   LAPAROSCOPIC BILATERAL SALPINGECTOMY Bilateral 01/11/2013   Procedure: LAPAROSCOPIC BILATERAL SALPINGECTOMY;  Surgeon: Jon CINDERELLA Rummer, MD;  Location: [facility];  Service: Gynecology;  Laterality: Bilateral;   VAGINAL HYSTERECTOMY N/A 08/24/2015   Procedure: TOTAL VAGINAL HYSTERECTOMY Bilateral Salpingectomy;  Surgeon: Jon Rummer, MD;  Location: [facility];  Service: Gynecology;  Laterality: N/A;   WISDOM TOOTH EXTRACTION      Social History:  reports that she has never smoked. She has never used smokeless tobacco. She reports that she does not drink alcohol and does not use drugs.  Allergies: Allergies[1]  Family History:  History reviewed. No pertinent family history.  Current Medications[2]  Review of Systems:  Negative unless indicated in HPI.   Physical Exam: Vitals:   02/22/24 0827 02/22/24 0830  BP: (!)  134/98 (!) 144/93  Pulse: 73   Temp: 98.7 F (37.1 C)   TempSrc: Oral   SpO2: 97%   Weight: 234 lb 11.2 oz (106.5 kg)     Body mass index is 37.31 kg/m.   Physical Exam Vitals reviewed.  Constitutional:      Appearance: Normal appearance. She is obese.  HENT:     Head: Normocephalic and atraumatic.  Eyes:     Conjunctiva/sclera: Conjunctivae normal.  Cardiovascular:     Rate and Rhythm: Normal rate and regular rhythm.  Pulmonary:     Effort: Pulmonary effort is normal.     Breath sounds: Normal breath sounds.  Skin:    General: Skin is warm and dry.  Neurological:     General: No focal deficit present.     Mental Status: She is alert and oriented to person, place, and time.  Psychiatric:        Mood and Affect: Mood normal.        Behavior: Behavior normal.        Thought Content: Thought content normal.        Judgment: Judgment normal.      Impression and Plan:  Primary hypertension   - Improved blood pressure control on amlodipine  5 mg.  Feel it is a little premature to add a secondary medication given significant improvement in only 2 weeks.  She will continue to do ambulatory blood pressure measurements and follow-up with me in another 4 weeks to determine if further antihypertensive therapy is needed.  Time spent:22 minutes reviewing chart, interviewing  and examining patient and formulating plan of care.     Tully Theophilus Andrews, MD [facility]     [1] No Known Allergies [2]  Current Outpatient Medications:    amLODipine  (NORVASC ) 5 MG tablet, Take 1 tablet (5 mg total) by mouth daily., Disp: 30 tablet, Rfl: 1   Ascorbic Acid (VITAMIN C PO), Take 500 mg by mouth daily., Disp: , Rfl:    ciprofloxacin  (CIPRO ) 500 MG tablet, Take 1 tablet (500 mg total) by mouth 2 (two) times daily., Disp: 14 tablet, Rfl: 0   Multiple Vitamins-Minerals (MULTIVITAMIN WITH MINERALS) tablet, Take 1 tablet by mouth daily., Disp: , Rfl:

## 2024-02-28 ENCOUNTER — Other Ambulatory Visit: Payer: Self-pay | Admitting: Family Medicine

## 2024-04-08 ENCOUNTER — Ambulatory Visit: Admitting: Internal Medicine

## 2024-04-19 ENCOUNTER — Ambulatory Visit: Admitting: Family Medicine

## 2024-04-19 ENCOUNTER — Encounter: Payer: Self-pay | Admitting: Family Medicine

## 2024-04-19 ENCOUNTER — Ambulatory Visit: Payer: Self-pay

## 2024-04-19 VITALS — BP 160/82 | HR 94 | Temp 98.8°F | Ht 66.5 in | Wt 232.1 lb

## 2024-04-19 DIAGNOSIS — K5792 Diverticulitis of intestine, part unspecified, without perforation or abscess without bleeding: Secondary | ICD-10-CM

## 2024-04-19 DIAGNOSIS — K921 Melena: Secondary | ICD-10-CM

## 2024-04-19 MED ORDER — ONDANSETRON 4 MG PO TBDP: 4.0000 mg | ORAL_TABLET | Freq: Three times a day (TID) | ORAL | 0 refills | Status: AC | PRN

## 2024-04-19 MED ORDER — METRONIDAZOLE 500 MG PO TABS: 500.0000 mg | ORAL_TABLET | Freq: Three times a day (TID) | ORAL | 0 refills | Status: AC

## 2024-04-19 MED ORDER — CIPROFLOXACIN HCL 500 MG PO TABS: 500.0000 mg | ORAL_TABLET | Freq: Two times a day (BID) | ORAL | 0 refills | Status: AC

## 2024-04-19 NOTE — Telephone Encounter (Signed)
 FYI Only or Action Required?: Action required by provider: request for appointment.  Patient was last seen in primary care on 02/22/2024 by Theophilus Andrews, Tully GRADE, MD.  Called Nurse Triage reporting Vomiting.  Symptoms began today.  Interventions attempted: Nothing.  Symptoms are: unchanged.Vomited x 1, has had 6 loose stools. Blood in toilet water .Pt. concerned.  Triage Disposition: See Physician Within 24 Hours  Patient/caregiver understands and will follow disposition?: Yes      Reason for Triage: Patient has been vomiting , having stomach aches and diarrhea since this morning and she stated the last 2 vowel movements had blood in it.   Reason for Disposition  [1] MILD or MODERATE vomiting AND [2] present > 48 hours (2 days)  (Exception: Mild vomiting with associated diarrhea.)  Answer Assessment - Initial Assessment Questions 1. VOMITING SEVERITY: How many times have you vomited in the past 24 hours?      X 1 2. ONSET: When did the vomiting begin?      today 3. FLUIDS: What fluids or food have you vomited up today? Have you been able to keep any fluids down?     yes 4. ABDOMEN PAIN: Are your having any abdomen pain? If Yes : How bad is it and what does it feel like? (e.g., crampy, dull, intermittent, constant)      cramp 5. DIARRHEA: Is there any diarrhea? If Yes, ask: How many times today?      Yes - bloody x 6 6. CONTACTS: Is there anyone else in the family with the same symptoms?      no 7. CAUSE: What do you think is causing your vomiting?     unsure 8. HYDRATION STATUS: Any signs of dehydration? (e.g., dry mouth [not only dry lips], too weak to stand) When did you last urinate?     no 9. OTHER SYMPTOMS: Do you have any other symptoms? (e.g., fever, headache, vertigo, vomiting blood or coffee grounds, recent head injury)     no 10. PREGNANCY: Is there any chance you are pregnant? When was your last menstrual period?        no  Protocols used: Vomiting-A-AH

## 2024-04-19 NOTE — Telephone Encounter (Signed)
 Appt today-2/6 with Dr Ozell at 4pm.

## 2024-04-19 NOTE — Progress Notes (Unsigned)
" ° °  Acute Office Visit  Subjective:     Patient ID: Teresa Freeman, female    DOB: [birth year], 49 y.o.   MRN: [id number]  Chief Complaint  Patient presents with   Abdominal Pain    Patient complains of sudden onset of LLQ abdominal pain and cramps since 2:30am   Diarrhea    Diarrhea and bloody stools since 2:30am   Emesis    Abdominal Pain Associated symptoms include diarrhea and vomiting.  Diarrhea  Associated symptoms include abdominal pain and vomiting.  Emesis  Associated symptoms include abdominal pain and diarrhea.     Review of Systems  Gastrointestinal:  Positive for abdominal pain, diarrhea and vomiting.  All other systems reviewed and are negative.       Objective:    BP (!) 160/82   Pulse 94   Temp 98.8 F (37.1 C) (Oral)   Ht 5' 6.5 (1.689 m)   Wt 232 lb 1.6 oz (105.3 kg)   LMP 07/22/2015   SpO2 99%   BMI 36.90 kg/m  {Vitals History (Optional):23777}  Physical Exam Vitals reviewed.  Constitutional:      Appearance: She is well-developed. She is obese.  Neurological:     Mental Status: She is alert.     No results found for any visits on 04/19/24.      Assessment & Plan:   Problem List Items Addressed This Visit   None Visit Diagnoses       Hematochezia    -  Primary   Relevant Medications   ciprofloxacin  (CIPRO ) 500 MG tablet   metroNIDAZOLE  (FLAGYL ) 500 MG tablet   Other Relevant Orders   CT ABDOMEN PELVIS W WO CONTRAST     Acute diverticulitis       Relevant Medications   ciprofloxacin  (CIPRO ) 500 MG tablet   metroNIDAZOLE  (FLAGYL ) 500 MG tablet   ondansetron  (ZOFRAN -ODT) 4 MG disintegrating tablet       Meds ordered this encounter  Medications   ciprofloxacin  (CIPRO ) 500 MG tablet    Sig: Take 1 tablet (500 mg total) by mouth 2 (two) times daily for 5 days.    Dispense:  10 tablet    Refill:  0   metroNIDAZOLE  (FLAGYL ) 500 MG tablet    Sig: Take 1 tablet (500 mg total) by mouth 3 (three) times daily for 7 days.     Dispense:  21 tablet    Refill:  0   ondansetron  (ZOFRAN -ODT) 4 MG disintegrating tablet    Sig: Take 1 tablet (4 mg total) by mouth every 8 (eight) hours as needed for nausea or vomiting.    Dispense:  20 tablet    Refill:  0    No follow-ups on file.  Heron CHRISTELLA Sharper, MD   "
